# Patient Record
Sex: Female | Born: 1975
Health system: Southern US, Community
[De-identification: ages and names within clinical notes are randomized; demographics above are authoritative.]

## PROBLEM LIST (undated history)

## (undated) DIAGNOSIS — IMO0001 Reserved for inherently not codable concepts without codable children: Secondary | ICD-10-CM

## (undated) DIAGNOSIS — N809 Endometriosis, unspecified: Secondary | ICD-10-CM

## (undated) DIAGNOSIS — K219 Gastro-esophageal reflux disease without esophagitis: Secondary | ICD-10-CM

## (undated) DIAGNOSIS — F410 Panic disorder [episodic paroxysmal anxiety] without agoraphobia: Secondary | ICD-10-CM

## (undated) DIAGNOSIS — T7840XA Allergy, unspecified, initial encounter: Secondary | ICD-10-CM

## (undated) DIAGNOSIS — Q5210 Doubling of vagina, unspecified: Secondary | ICD-10-CM

## (undated) DIAGNOSIS — D649 Anemia, unspecified: Secondary | ICD-10-CM

## (undated) HISTORY — DX: Anemia, unspecified: D64.9

## (undated) HISTORY — DX: Gastro-esophageal reflux disease without esophagitis: K21.9

## (undated) HISTORY — PX: CHOLECYSTECTOMY: SHX55

## (undated) HISTORY — DX: Reserved for inherently not codable concepts without codable children: IMO0001

## (undated) HISTORY — DX: Allergy, unspecified, initial encounter: T78.40XA

## (undated) HISTORY — DX: Endometriosis, unspecified: N80.9

## (undated) HISTORY — DX: Doubling of vagina, unspecified: Q52.10

---

## 2005-01-02 HISTORY — PX: OVARIAN CYST REMOVAL: SHX89

## 2012-01-16 ENCOUNTER — Emergency Department (HOSPITAL_BASED_OUTPATIENT_CLINIC_OR_DEPARTMENT_OTHER)
Admission: EM | Admit: 2012-01-16 | Discharge: 2012-01-16 | Disposition: A | Discharge status: Home / Self Care | Payer: BC Managed Care – PPO | Attending: Emergency Medicine | Admitting: Emergency Medicine

## 2012-01-16 ENCOUNTER — Emergency Department (HOSPITAL_BASED_OUTPATIENT_CLINIC_OR_DEPARTMENT_OTHER): Payer: BC Managed Care – PPO

## 2012-01-16 ENCOUNTER — Encounter (HOSPITAL_BASED_OUTPATIENT_CLINIC_OR_DEPARTMENT_OTHER): Payer: Self-pay

## 2012-01-16 DIAGNOSIS — D1809 Hemangioma of other sites: Secondary | ICD-10-CM | POA: Insufficient documentation

## 2012-01-16 DIAGNOSIS — Z3202 Encounter for pregnancy test, result negative: Secondary | ICD-10-CM | POA: Insufficient documentation

## 2012-01-16 DIAGNOSIS — D1803 Hemangioma of intra-abdominal structures: Secondary | ICD-10-CM

## 2012-01-16 DIAGNOSIS — Z8659 Personal history of other mental and behavioral disorders: Secondary | ICD-10-CM | POA: Insufficient documentation

## 2012-01-16 DIAGNOSIS — R110 Nausea: Secondary | ICD-10-CM | POA: Insufficient documentation

## 2012-01-16 DIAGNOSIS — R109 Unspecified abdominal pain: Secondary | ICD-10-CM

## 2012-01-16 HISTORY — DX: Panic disorder (episodic paroxysmal anxiety): F41.0

## 2012-01-16 LAB — COMPREHENSIVE METABOLIC PANEL
ALT: 13 U/L (ref 0–35)
AST: 13 U/L (ref 0–37)
CO2: 23 mEq/L (ref 19–32)
Chloride: 103 mEq/L (ref 96–112)
GFR calc Af Amer: >90 mL/min (ref >90)
GFR calc non Af Amer: >90 mL/min (ref >90)
Glucose, Bld: 106 mg/dL — ABNORMAL HIGH (ref 70–99)
Sodium: 139 mEq/L (ref 135–145)
Total Bilirubin: 0.3 mg/dL (ref 0.3–1.2)

## 2012-01-16 LAB — CBC WITH DIFFERENTIAL/PLATELET
Basophils Absolute: 0 10*3/uL (ref 0.0–0.1)
Eosinophils Relative: 1 % (ref 0–5)
HCT: 41.2 % (ref 36.0–46.0)
Lymphocytes Relative: 17 % (ref 12–46)
Lymphs Abs: 1.8 10*3/uL (ref 0.7–4.0)
MCV: 93 fL (ref 78.0–100.0)
Monocytes Absolute: 0.7 10*3/uL (ref 0.1–1.0)
Neutro Abs: 7.8 10*3/uL — ABNORMAL HIGH (ref 1.7–7.7)
Platelets: 291 10*3/uL (ref 150–400)
RBC: 4.43 MIL/uL (ref 3.87–5.11)
RDW: 12.5 % (ref 11.5–15.5)
WBC: 10.4 10*3/uL (ref 4.0–10.5)

## 2012-01-16 LAB — URINALYSIS, ROUTINE W REFLEX MICROSCOPIC
Bilirubin Urine: NEGATIVE
Specific Gravity, Urine: 1.014 (ref 1.005–1.030)
Urobilinogen, UA: 0.2 mg/dL (ref 0.0–1.0)
pH: 8 (ref 5.0–8.0)

## 2012-01-16 LAB — PREGNANCY, URINE: Preg Test, Ur: NEGATIVE

## 2012-01-16 MED ORDER — PANTOPRAZOLE SODIUM 20 MG PO TBEC: 20.0000 mg | DELAYED_RELEASE_TABLET | Freq: Every day | ORAL | Status: DC

## 2012-01-16 NOTE — ED Notes (Signed)
Pt voided prior to coming to tx area-given CCUA kit and advised to let staff know if voids

## 2012-01-16 NOTE — ED Provider Notes (Signed)
History     CSN: [id number]  Arrival date & time 01/16/12  1406   First MD Initiated Contact with Patient 01/16/12 1417      Chief Complaint  Patient presents with  . Abdominal Pain    (Consider location/radiation/quality/duration/timing/severity/associated sxs/prior treatment) HPI Comments: Pt states that she has had burning in the upper abdomen for the last 3 weeks and now the burning has been persistent for the last NWG:NFAOZHY makes it better or worse  Patient is a 37 y.o. female presenting with abdominal pain. The history is provided by the patient. No language interpreter was used.  Abdominal Pain The primary symptoms of the illness include abdominal pain, nausea and vaginal bleeding. The primary symptoms of the illness do not include fever, vomiting, dysuria or vaginal discharge. The current episode started more than 2 days ago. The onset of the illness was gradual. The problem has not changed since onset.   Past Medical History  Diagnosis Date  . Panic attacks     Past Surgical History  Procedure Date  . Cesarean section     No family history on file.  History  Substance Use Topics  . Smoking status: Never Smoker   . Smokeless tobacco: Not on file  . Alcohol Use: No    OB History    Grav Para Term Preterm Abortions TAB SAB Ect Mult Living                  Review of Systems  Constitutional: Negative for fever.  Respiratory: Negative.   Cardiovascular: Negative.   Gastrointestinal: Positive for nausea and abdominal pain. Negative for vomiting.  Genitourinary: Positive for vaginal bleeding. Negative for dysuria and vaginal discharge.    Allergies  Demerol  Home Medications   Current Outpatient Rx  Name  Route  Sig  Dispense  Refill  . CLARITIN PO   Oral   Take by mouth.           BP 121/88  Pulse 100  Temp 98.8 F (37.1 C) (Oral)  Resp 20  Ht 5\' 5"  (1.651 m)  Wt 168 lb (76.204 kg)  BMI 27.96 kg/m2  SpO2 100%  LMP  01/16/2012  Physical Exam  Nursing note and vitals reviewed. Constitutional: She is oriented to person, place, and time. She appears well-developed and well-nourished.  HENT:  Head: Normocephalic and atraumatic.  Neck: Normal range of motion. Neck supple.  Cardiovascular: Normal rate and regular rhythm.   Pulmonary/Chest: Effort normal and breath sounds normal.  Abdominal: Soft. Bowel sounds are normal. There is tenderness in the epigastric area.  Musculoskeletal: Normal range of motion.  Neurological: She is alert and oriented to person, place, and time.  Skin: Skin is warm and dry.  Psychiatric: She has a normal mood and affect.    ED Course  Procedures (including critical care time)  Labs Reviewed  URINALYSIS, ROUTINE W REFLEX MICROSCOPIC - Abnormal; Notable for the following:    Hgb urine dipstick LARGE (*)     All other components within normal limits  CBC WITH DIFFERENTIAL - Abnormal; Notable for the following:    Neutro Abs 7.8 (*)     All other components within normal limits  COMPREHENSIVE METABOLIC PANEL - Abnormal; Notable for the following:    Potassium 3.4 (*)     Glucose, Bld 106 (*)     All other components within normal limits  PREGNANCY, URINE  LIPASE, BLOOD  URINE MICROSCOPIC-ADD ON   US Abdomen Complete  01/16/2012  *  RADIOLOGY REPORT*  Clinical Data:  Abdominal pain  COMPLETE ABDOMINAL ULTRASOUND  Comparison:  None.  Findings:  Gallbladder:  No gallstones, gallbladder wall thickening, or pericholecystic fluid.  Negative sonographic Murphy's sign.  Common bile duct:  Measures 3 mm.  Liver:  7 x 6 x 6 mm probable hemangioma in the right hepatic lobe. Within normal limits in parenchymal echogenicity.  IVC:  Appears normal.  Pancreas:  Visualized portions of the pancreatic head and body are within normal limits.  Spleen:  Measures 5.2 cm.  Right Kidney:  Measures 10.9 cm.  No mass or hydronephrosis.  Left Kidney:  Measures 10.7 cm.  No mass or hydronephrosis.   Abdominal aorta:  No aneurysm identified.  IMPRESSION: Suspected 7 mm hemangioma in the right hepatic lobe.  Otherwise negative abdominal ultrasound.   Original Report Authenticated By: Charline Bills, M.D.      1. Hemangioma of liver   2. Abdominal pain       MDM  Discussed findings with pt and pt is given gi follow up        Teressa Lower, NP 01/16/12 1732

## 2012-01-16 NOTE — ED Notes (Signed)
Pt reports abdominal pain described as burning x 3 weeks.

## 2012-01-22 NOTE — ED Provider Notes (Signed)
Medical screening examination/treatment/procedure(s) were performed by non-physician practitioner and as supervising physician I was immediately available for consultation/collaboration.   Rolan Bucco, MD 01/22/12 [phone]

## 2012-10-16 ENCOUNTER — Other Ambulatory Visit: Payer: Self-pay | Admitting: Internal Medicine

## 2012-10-16 DIAGNOSIS — N63 Unspecified lump in unspecified breast: Secondary | ICD-10-CM

## 2012-10-28 ENCOUNTER — Ambulatory Visit
Admission: RE | Admit: 2012-10-28 | Discharge: 2012-10-28 | Disposition: A | Discharge status: Home / Self Care | Payer: BC Managed Care – PPO | Source: Ambulatory Visit | Attending: Internal Medicine | Admitting: Internal Medicine

## 2012-10-28 DIAGNOSIS — N63 Unspecified lump in unspecified breast: Secondary | ICD-10-CM

## 2014-03-05 ENCOUNTER — Ambulatory Visit (INDEPENDENT_AMBULATORY_CARE_PROVIDER_SITE_OTHER): Payer: BLUE CROSS/BLUE SHIELD | Admitting: Medical

## 2014-03-05 ENCOUNTER — Encounter: Payer: Self-pay | Admitting: Medical

## 2014-03-05 VITALS — BP 110/79 | HR 87 | Temp 98.3°F | Wt 165.4 lb

## 2014-03-05 DIAGNOSIS — F411 Generalized anxiety disorder: Secondary | ICD-10-CM

## 2014-03-05 DIAGNOSIS — J302 Other seasonal allergic rhinitis: Secondary | ICD-10-CM

## 2014-03-05 DIAGNOSIS — R82998 Other abnormal findings in urine: Secondary | ICD-10-CM

## 2014-03-05 DIAGNOSIS — R319 Hematuria, unspecified: Secondary | ICD-10-CM

## 2014-03-05 DIAGNOSIS — N390 Urinary tract infection, site not specified: Secondary | ICD-10-CM

## 2014-03-05 DIAGNOSIS — N809 Endometriosis, unspecified: Secondary | ICD-10-CM

## 2014-03-05 HISTORY — DX: Generalized anxiety disorder: F41.1

## 2014-03-05 LAB — POCT URINALYSIS DIPSTICK
BILIRUBIN UA: NEGATIVE
GLUCOSE UA: NEGATIVE
Ketones, UA: NEGATIVE
NITRITE UA: NEGATIVE
Spec Grav, UA: 1.02
UROBILINOGEN UA: 0.2
pH, UA: 7

## 2014-03-05 MED ORDER — CIPROFLOXACIN HCL 250 MG PO TABS: 250.0000 mg | ORAL_TABLET | Freq: Two times a day (BID) | ORAL | Status: DC

## 2014-03-05 NOTE — Progress Notes (Signed)
Subjective:    Patient ID: Alicia Duffy, female    DOB: 1975/03/27, 39 y.o.   MRN: [id number]  HPI   I have reviewed pt PMH, PSH, FH, Social History and Surgical History  Allergies- seasonal. Spring and Winter is the worst.   Anxiety- She states 2007 4 months of anxiety. Was on medications and she wheened herself of med over period of time. She was on clonzapam.   Mother- Had pancreatic cancer.   Pt has hx of some urinary symptoms. She feel like her bladder is contracting since december. She has been seen twice and 2 cultures are negative. Some trace blood was seen in past.   Occasionally feels like chills with symptoms. Some occasional rt sided back pain about 2 wks ago. Come and goes. She points to cva area.  Pt took antibiotic in past and she did not get better.   Does have endometriosis hx.  LMP- February 8th and normal.`  Works season at [employer], no exercise, No caffeine. Married- 2 children. Both born csections.     Review of Systems  Constitutional: Negative for fever, chills and fatigue.  HENT: Negative for congestion, ear discharge, ear pain, nosebleeds, postnasal drip, rhinorrhea, sinus pressure, sore throat and trouble swallowing.   Respiratory: Negative for cough, chest tightness, shortness of breath and wheezing.   Cardiovascular: Negative for chest pain and palpitations.  Gastrointestinal: Negative for nausea, vomiting, abdominal pain, diarrhea and constipation.  Genitourinary: Negative for dysuria, frequency, flank pain, vaginal bleeding, vaginal discharge and vaginal pain.       Cramping of bladder sensation. Suprapubic pain on exam. Hx of hematuria and leukoctes. Culture neg.  Some pain reported on urination in past. But none today.  Musculoskeletal: Negative for back pain.  Neurological: Negative for dizziness, tremors, seizures, syncope, weakness, light-headedness, numbness and headaches.  Hematological: Negative for adenopathy. Does not  bruise/bleed easily.  Psychiatric/Behavioral: Negative for suicidal ideas, behavioral problems and dysphoric mood. The patient is not nervous/anxious.    Past Medical History  Diagnosis Date  . Panic attacks   . Allergy     History   Social History  . Marital Status: Married    Spouse Name: N/A  . Number of Children: N/A  . Years of Education: N/A   Occupational History  . Not on file.   Social History Main Topics  . Smoking status: Never Smoker   . Smokeless tobacco: Not on file  . Alcohol Use: No  . Drug Use: No  . Sexual Activity: Yes   Other Topics Concern  . Not on file   Social History Narrative    Past Surgical History  Procedure Laterality Date  . Cesarean section    . Cholecystectomy      Family History  Problem Relation Age of Onset  . Cancer Mother   . Heart disease Father   . Depression Father     Allergies  Allergen Reactions  . Demerol [Meperidine] Itching    Current Outpatient Prescriptions on File Prior to Visit  Medication Sig Dispense Refill  . Loratadine (CLARITIN PO) Take by mouth.     No current facility-administered medications on file prior to visit.    BP 110/79 mmHg  Pulse 87  Temp(Src) 98.3 F (36.8 C) (Oral)  Wt 165 lb 6.4 oz (75.025 kg)  SpO2 100%  LMP 02/09/2014      Objective:   Physical Exam General  Mental Status- Alert. Orientation- Orientation x 4.   Skin General:- Normal. Moisture-  Dry. Temperature- Warm.  HEENT Head- normal.  Neck Neck- Supple.  Heart Ausculation-RRR  Lungs Ausculation- Clear, even, unlabored bilaterlly.    Abdomen Palpation/Percussion: Palpation and Percussion of the abdomen reveal- faint Tender suprapubic area, No Rebound tenderness, No Rigidity(guarding), No Palpable abdominal masses and No jar tenderness. No suprapubic tenderness. Liver:-Normal. Spleen:- Normal. Other Characteristics- No Costovertebral angle tenderness- Left or Costovertebral angle tenderness- Right.    Auscultation: Auscultation of the abdomen reveals- Bowel Sounds normal.  Back- no cva tenderness.     Assessment & Plan:

## 2014-03-05 NOTE — Assessment & Plan Note (Signed)
Hx of but no symptoms recently per her report.

## 2014-03-05 NOTE — Patient Instructions (Signed)
I will get urine culture today. I will refer you to urologsit for history of hemturia as well as possible interstiitial cystitis.  During the interim will rx 3 days of cipro.  May in future refer you to gyn with urologist work up negative. If cause found then we can do your pap here.  Follow up in 3-5 wks(any persisting signs or symptoms) or as needed.

## 2014-03-05 NOTE — Progress Notes (Signed)
Pre visit review using our clinic review tool, if applicable. No additional management support is needed unless otherwise documented below in the visit note. 

## 2014-03-05 NOTE — Assessment & Plan Note (Signed)
Hx of but well controlled. She has read books and knows how to handle her anxiety per her report.

## 2014-03-07 LAB — URINE CULTURE
Colony Count: NO GROWTH
ORGANISM ID, BACTERIA: NO GROWTH

## 2014-03-11 ENCOUNTER — Ambulatory Visit: Payer: Self-pay | Admitting: Physician Assistant

## 2014-03-30 ENCOUNTER — Encounter: Payer: Self-pay | Admitting: Internal Medicine

## 2014-03-30 ENCOUNTER — Telehealth: Payer: Self-pay | Admitting: Medical

## 2014-03-30 ENCOUNTER — Ambulatory Visit (INDEPENDENT_AMBULATORY_CARE_PROVIDER_SITE_OTHER): Payer: BLUE CROSS/BLUE SHIELD | Admitting: Internal Medicine

## 2014-03-30 VITALS — BP 118/64 | HR 88 | Temp 98.0°F | Wt 162.0 lb

## 2014-03-30 DIAGNOSIS — N809 Endometriosis, unspecified: Secondary | ICD-10-CM

## 2014-03-30 DIAGNOSIS — N390 Urinary tract infection, site not specified: Secondary | ICD-10-CM | POA: Diagnosis not present

## 2014-03-30 DIAGNOSIS — R82998 Other abnormal findings in urine: Secondary | ICD-10-CM

## 2014-03-30 DIAGNOSIS — A048 Other specified bacterial intestinal infections: Secondary | ICD-10-CM | POA: Insufficient documentation

## 2014-03-30 DIAGNOSIS — M549 Dorsalgia, unspecified: Secondary | ICD-10-CM | POA: Diagnosis not present

## 2014-03-30 LAB — POCT URINE PREGNANCY: PREG TEST UR: NEGATIVE

## 2014-03-30 NOTE — Patient Instructions (Signed)
  Drink plenty of fluids  Keep your appointment to see the gynecologist   Keep appointment to see the urologist  Call if you are not back to normal next month

## 2014-03-30 NOTE — Assessment & Plan Note (Signed)
See comments under leukocyturia

## 2014-03-30 NOTE — Assessment & Plan Note (Addendum)
UTI? The patient had a UTI back in December based on symptoms and urinalysis. A urine culture a few days ago was negative, Udip did show a trace of red cells and white cells. I don't think she has a UTI at this point, we will recheck a UA mostly to see if she has pyuria or microscopic hematuria. Also rx an  abdominal ultrasound to assess the kidneys. She already has an appointment tosee  urology.  As far as the lower abdominal pain today, she has a history of endometriosis and they pain resembled previous episodes. She has an appointment to see gynecologist in few days consequently will let the gynecologist completed the assessment next week, needs a pelvic exam although she is low risk for PID. UPT neg

## 2014-03-30 NOTE — Progress Notes (Signed)
Pre visit review using our clinic review tool, if applicable. No additional management support is needed unless otherwise documented below in the visit note. 

## 2014-03-30 NOTE — Telephone Encounter (Signed)
Patient Name: Alicia Duffy  DOB: 10-04-1975    Initial Comment caller states she is short of breath, pain under shoulder blade radiating down her back and groin area and has a metallic taste in her mouth   Nurse Assessment  Nurse: Wynetta Emery, RN, Baker Janus Date/Time Eilene Ghazi Time): 03/30/2014 7:56:42 AM  Confirm and document reason for call. If symptomatic, describe symptoms. ---[name] states the symptoms are getting worse have had these since December; She continues to be short of breath, pain under shoulder blade radiating down her back and pain started in groin area this weekend and continues to have a metallic taste in her mouth  Has the patient traveled out of the country within the last 30 days? ---No  Does the patient require triage? ---Yes  Related visit to physician within the last 2 weeks? ---No  Does the PT have any chronic conditions? (i.e. diabetes, asthma, etc.) ---No  Did the patient indicate they were pregnant? ---No     Guidelines    Guideline Title Affirmed Question Affirmed Notes  Back Pain [1] SEVERE back pain (e.g., excruciating, unable to do any normal activities) AND [2] not improved 2 hours after pain medicine    Final Disposition User   See Physician within 4 Hours (or PCP triage) Wynetta Emery, RN, Baker Janus    Comments  States she wants an appt today if possible and with someone else to get different opinion; she thinks she has a major UTI that has not been treated

## 2014-03-30 NOTE — Progress Notes (Signed)
Subjective:    Patient ID: Alicia Duffy, female    DOB: January 18, 1975, 39 y.o.   MRN: [id number]  DOS:  03/30/2014 Type of visit - description : acute Interval history: Symptoms started in October, was seen for the first time 12/17/2013 @ [facility] with the following symptoms: Urinary urgency, frequency, dysuria, chills. At that time, the urine show RBCs and WBCs too numerous to count. Urine culture is not available to me. BMP, LFTs, CBC normal. H. pylori serology and breath  test negative per patient. She was not feeling better and subsequently went to another  Clinic [facility]) , they check a urine and she had trace blood, they did not prescribe further antibiotics. Eventually after more than a week urinary sx improved and she become asymptomatic. She was seen in this office recently, she was still concerned by the fact that she had red cell an in the urine when she went to [place], udip here  showed a trace of red cells and white cells (no UA), Cipro was prescribed empirically, urine culture was negative. She is here and for a follow-up on wonders if she still has a UTI.   On further questioning, she is having lower abdominal discomfort for the last few days along with low back pain, this is not the first time she has dose symptoms, has a history of endometriosis.     Review of Systems  no fever, occasional chills, no recent weight loss. Some nausea, no vomiting, diarrhea blood in the stools. No vaginal discharge or vaginal bleeding Last menstrual period 03/13/2014. Periods  regular.  Again no uti sx at this time   Past Medical History  Diagnosis Date  . Panic attacks   . Allergy   . Endometriosis     dx 2007 at time of surhery  . Contraception     husband vasectomy    Past Surgical History  Procedure Laterality Date  . Cesarean section    . Cholecystectomy    . Ovarian cyst removal Left 2007    History   Social History  . Marital Status: Married    Spouse Name: N/A    . Number of Children: 2  . Years of Education: N/A   Occupational History  . works part time     Social History Main Topics  . Smoking status: Never Smoker   . Smokeless tobacco: Not on file  . Alcohol Use: No  . Drug Use: No  . Sexual Activity: Yes   Other Topics Concern  . Not on file   Social History Narrative        Medication List       This list is accurate as of: 03/30/14  9:39 PM.  Always use your most recent med list.               CLARITIN PO  Take by mouth.     WOMENS MULTI VITAMIN & MINERAL PO  Take by mouth.           Objective:   Physical Exam BP 118/64 mmHg  Pulse 88  Temp(Src) 98 F (36.7 C) (Oral)  Wt 162 lb (73.483 kg)  SpO2 98%  LMP 03/13/2014 (Approximate)  General:   Well developed, well nourished . NAD.  HEENT:  Normocephalic . Face symmetric, atraumatic Lungs:  CTA B Normal respiratory effort, no intercostal retractions, no accessory muscle use. Heart: RRR,  no murmur.  Abdomen:  Not distended, soft,  Mild tenderness lower abdomen and bilateral sides, no  rebound or rigidity. No mass,organomegaly Muscle skeletal: no pretibial edema bilaterally  Skin: Not pale. Not jaundice Neurologic:  alert & oriented X3.  Speech normal, gait appropriate for age and unassisted Psych--  Cognition and judgment appear intact.  Cooperative with normal attention span and concentration.  Behavior appropriate. No anxious or depressed appearing.

## 2014-03-30 NOTE — Telephone Encounter (Signed)
Patient has appointment 2pm today.

## 2014-03-31 ENCOUNTER — Ambulatory Visit (HOSPITAL_BASED_OUTPATIENT_CLINIC_OR_DEPARTMENT_OTHER)
Admission: RE | Admit: 2014-03-31 | Discharge: 2014-03-31 | Disposition: A | Discharge status: Home / Self Care | Payer: BLUE CROSS/BLUE SHIELD | Source: Ambulatory Visit | Attending: Internal Medicine | Admitting: Internal Medicine

## 2014-03-31 DIAGNOSIS — N390 Urinary tract infection, site not specified: Secondary | ICD-10-CM | POA: Insufficient documentation

## 2014-03-31 DIAGNOSIS — R82998 Other abnormal findings in urine: Secondary | ICD-10-CM

## 2014-03-31 LAB — URINALYSIS, ROUTINE W REFLEX MICROSCOPIC
BILIRUBIN URINE: NEGATIVE
HGB URINE DIPSTICK: NEGATIVE
Ketones, ur: NEGATIVE
LEUKOCYTES UA: NEGATIVE
NITRITE: NEGATIVE
PH: 6 (ref 5.0–8.0)
Specific Gravity, Urine: 1.025 (ref 1.000–1.030)
Total Protein, Urine: NEGATIVE
URINE GLUCOSE: NEGATIVE
Urobilinogen, UA: 0.2 (ref 0.0–1.0)

## 2014-05-15 ENCOUNTER — Encounter: Payer: Self-pay | Admitting: Internal Medicine

## 2014-07-13 ENCOUNTER — Ambulatory Visit (HOSPITAL_BASED_OUTPATIENT_CLINIC_OR_DEPARTMENT_OTHER)
Admission: RE | Admit: 2014-07-13 | Discharge: 2014-07-13 | Disposition: A | Discharge status: Home / Self Care | Payer: BLUE CROSS/BLUE SHIELD | Source: Ambulatory Visit | Attending: Internal Medicine | Admitting: Internal Medicine

## 2014-07-13 ENCOUNTER — Ambulatory Visit (INDEPENDENT_AMBULATORY_CARE_PROVIDER_SITE_OTHER): Payer: BLUE CROSS/BLUE SHIELD | Admitting: Internal Medicine

## 2014-07-13 ENCOUNTER — Encounter: Payer: Self-pay | Admitting: Internal Medicine

## 2014-07-13 ENCOUNTER — Telehealth: Payer: Self-pay | Admitting: Internal Medicine

## 2014-07-13 VITALS — BP 124/64 | HR 81 | Temp 98.0°F | Ht 64.5 in | Wt 162.2 lb

## 2014-07-13 DIAGNOSIS — M79602 Pain in left arm: Secondary | ICD-10-CM | POA: Insufficient documentation

## 2014-07-13 DIAGNOSIS — R0789 Other chest pain: Secondary | ICD-10-CM

## 2014-07-13 DIAGNOSIS — F411 Generalized anxiety disorder: Secondary | ICD-10-CM

## 2014-07-13 DIAGNOSIS — R079 Chest pain, unspecified: Secondary | ICD-10-CM

## 2014-07-13 DIAGNOSIS — N644 Mastodynia: Secondary | ICD-10-CM | POA: Diagnosis not present

## 2014-07-13 HISTORY — DX: Chest pain, unspecified: R07.9

## 2014-07-13 HISTORY — DX: Other chest pain: R07.89

## 2014-07-13 MED ORDER — LORATADINE 10 MG PO TABS: 10.0000 mg | ORAL_TABLET | Freq: Every day | ORAL | Status: DC

## 2014-07-13 MED ORDER — CLONAZEPAM 0.5 MG PO TABS: 0.5000 mg | ORAL_TABLET | Freq: Two times a day (BID) | ORAL | Status: DC | PRN

## 2014-07-13 NOTE — Telephone Encounter (Signed)
Noted  

## 2014-07-13 NOTE — Assessment & Plan Note (Signed)
39 year old female, nonsmoker, presents with atypical chest pain. EKG normal sinus rhythm, physical exam is negative. History does not point to a cardiovascular etiology or  PE.  Plan: For completeness recommend a chest x-ray and rx observation. If she is not gradually improving she will let me know.

## 2014-07-13 NOTE — Assessment & Plan Note (Signed)
  History of anxiety, she takes clonazepam 0.5 one twice a day very seldom, last prescription was several years ago and she still has a leftover. Request a refill. Plan: Refill meds, to take sparingly, knows not to drive if she takes clonazepam

## 2014-07-13 NOTE — Patient Instructions (Signed)
Stop by the first floor and get the XR     Tylenol  500 mg OTC 2 tabs a day every 8 hours as needed for pain  IBUPROFEN (Advil or Motrin) 200 mg 2 tablets every 6 hours as needed for pain.  Always take it with food because may cause gastritis and ulcers.  If you notice nausea, stomach pain, change in the color of stools --->  Stop the medicine and let us know \

## 2014-07-13 NOTE — Progress Notes (Signed)
Pre visit review using our clinic review tool, if applicable. No additional management support is needed unless otherwise documented below in the visit note. 

## 2014-07-13 NOTE — Progress Notes (Signed)
   Subjective:    Patient ID: Alicia Duffy, female    DOB: Apr 09, 1975, 39 y.o.   MRN: [id number]  DOS:  07/13/2014 Type of visit - description : Acute Interval history:  5 days history of chest pain, located at the left side, just proximal from the breast, on and off, episodes are random, at rest, when doing something, sometimes wake her up from sleep. They last from minutes to all day, today she has been hurting for several hours. Sometimes he goes to the shoulder or ready to straight back. Does not change with deep breaths or by eating.   Review of Systems  Denies fever chills No nausea, vomiting, diarrhea or blood in the stools No GERD symptoms No cough No rash Self breast examination normal. Denies leg pain, swelling or recent prolonged trips  Past Medical History  Diagnosis Date  . Panic attacks   . Allergy   . Endometriosis     dx 2007 at time of surhery  . Contraception     husband vasectomy  . Vaginal septum     [name], OB/GYN    Past Surgical History  Procedure Laterality Date  . Cesarean section    . Cholecystectomy    . Ovarian cyst removal Left 2007    History   Social History  . Marital Status: Married    Spouse Name: N/A  . Number of Children: 2  . Years of Education: N/A   Occupational History  . works part time     Social History Main Topics  . Smoking status: Never Smoker   . Smokeless tobacco: Not on file  . Alcohol Use: No  . Drug Use: No  . Sexual Activity: Yes   Other Topics Concern  . Not on file   Social History Narrative        Medication List       This list is accurate as of: 07/13/14  3:23 PM.  Always use your most recent med list.               CLARITIN PO  Take by mouth.     WOMENS MULTI VITAMIN & MINERAL PO  Take by mouth.           Objective:   Physical Exam BP 124/64 mmHg  Pulse 81  Temp(Src) 98 F (36.7 C) (Oral)  Ht 5' 4.5" (1.638 m)  Wt 162 lb 4 oz (73.596 kg)  BMI 27.43 kg/m2   SpO2 98%  LMP 07/06/2014 (Approximate) General:   Well developed, well nourished . NAD.  HEENT:  Normocephalic . Face symmetric, atraumatic Neck: No TTP at the cervical spine. No mass or LAD Lungs:  CTA B Normal respiratory effort, no intercostal retractions, no accessory muscle use. Heart: RRR,  no murmur.  No pretibial edema bilaterally  Chest wall: No TTP Breast: no dominant mass, skin and nipples normal to inspection on palpation, axillary areas without mass or lymphadenopathy Abdomen: Not distended, soft, minimal tenderness at the lower abdomen, not a new finding per patient.  Skin: Not pale. Not jaundice, no rash Neurologic:  alert & oriented X3.  Speech normal, gait appropriate for age and unassisted Psych--  Cognition and judgment appear intact.  Cooperative with normal attention span and concentration.  Behavior appropriate. No anxious or depressed appearing.        Assessment & Plan:

## 2014-07-13 NOTE — Telephone Encounter (Signed)
Patient Name: ZADA HASER DOB: 1975-03-01 Initial Comment caller states they are c/o left chest and breast pain - states it feels like a electrical current is going thru her Nurse Assessment Nurse: Ronnald Ramp, RN, Miranda Date/Time (Eastern Time): 07/13/2014 10:24:02 AM Confirm and document reason for call. If symptomatic, describe symptoms. ---Caller states she is having pain in her right breast for 5 days. Has the patient traveled out of the country within the last 30 days? ---Not Applicable Does the patient require triage? ---Yes Related visit to physician within the last 2 weeks? ---No Does the PT have any chronic conditions? (i.e. diabetes, asthma, etc.) ---Yes List chronic conditions. ---Allergies Did the patient indicate they were pregnant? ---No Guidelines Guideline Title Affirmed Question Affirmed Notes Breast Symptoms [1] Breast pain AND [2] cause is not known Final Disposition User See PCP within 2 Ronny Flurry, RN, Miranda Comments Appt scheduled for today at 2:45pm with Dr. Larose Kells

## 2015-01-29 ENCOUNTER — Encounter: Payer: Self-pay | Admitting: Internal Medicine

## 2015-01-29 ENCOUNTER — Ambulatory Visit (INDEPENDENT_AMBULATORY_CARE_PROVIDER_SITE_OTHER): Payer: BLUE CROSS/BLUE SHIELD | Admitting: Internal Medicine

## 2015-01-29 VITALS — BP 118/74 | HR 84 | Temp 97.7°F | Ht 64.5 in | Wt 155.0 lb

## 2015-01-29 DIAGNOSIS — F411 Generalized anxiety disorder: Secondary | ICD-10-CM

## 2015-01-29 DIAGNOSIS — R1010 Upper abdominal pain, unspecified: Secondary | ICD-10-CM | POA: Diagnosis not present

## 2015-01-29 LAB — URINALYSIS, ROUTINE W REFLEX MICROSCOPIC
BILIRUBIN URINE: NEGATIVE
HGB URINE DIPSTICK: NEGATIVE
Ketones, ur: NEGATIVE
LEUKOCYTES UA: NEGATIVE
NITRITE: NEGATIVE
Specific Gravity, Urine: >=1.03 — AB (ref 1.000–1.030)
TOTAL PROTEIN, URINE-UPE24: NEGATIVE
UROBILINOGEN UA: 0.2 (ref 0.0–1.0)
Urine Glucose: NEGATIVE
pH: 6 (ref 5.0–8.0)

## 2015-01-29 LAB — COMPREHENSIVE METABOLIC PANEL
ALBUMIN: 4.3 g/dL (ref 3.5–5.2)
ALK PHOS: 56 U/L (ref 39–117)
ALT: 13 U/L (ref 0–35)
AST: 14 U/L (ref 0–37)
BUN: 11 mg/dL (ref 6–23)
CHLORIDE: 103 meq/L (ref 96–112)
CO2: 26 mEq/L (ref 19–32)
Calcium: 9.2 mg/dL (ref 8.4–10.5)
Creatinine, Ser: 0.62 mg/dL (ref 0.40–1.20)
GFR: 113.53 mL/min (ref >60.00)
Glucose, Bld: 96 mg/dL (ref 70–99)
POTASSIUM: 3.7 meq/L (ref 3.5–5.1)
SODIUM: 137 meq/L (ref 135–145)
Total Bilirubin: 0.5 mg/dL (ref 0.2–1.2)
Total Protein: 7.1 g/dL (ref 6.0–8.3)

## 2015-01-29 LAB — CBC WITH DIFFERENTIAL/PLATELET
BASOS PCT: 0.6 % (ref 0.0–3.0)
Basophils Absolute: 0.1 10*3/uL (ref 0.0–0.1)
EOS PCT: 1.1 % (ref 0.0–5.0)
Eosinophils Absolute: 0.1 10*3/uL (ref 0.0–0.7)
HEMATOCRIT: 41.5 % (ref 36.0–46.0)
HEMOGLOBIN: 13.7 g/dL (ref 12.0–15.0)
LYMPHS PCT: 27.6 % (ref 12.0–46.0)
Lymphs Abs: 2.2 10*3/uL (ref 0.7–4.0)
MCHC: 33.1 g/dL (ref 30.0–36.0)
MCV: 92.8 fl (ref 78.0–100.0)
MONO ABS: 0.6 10*3/uL (ref 0.1–1.0)
MONOS PCT: 8.1 % (ref 3.0–12.0)
Neutro Abs: 5 10*3/uL (ref 1.4–7.7)
Neutrophils Relative %: 62.6 % (ref 43.0–77.0)
Platelets: 346 10*3/uL (ref 150.0–400.0)
RBC: 4.47 Mil/uL (ref 3.87–5.11)
RDW: 13 % (ref 11.5–15.5)
WBC: 7.9 10*3/uL (ref 4.0–10.5)

## 2015-01-29 LAB — AMYLASE: Amylase: 42 U/L (ref 27–131)

## 2015-01-29 LAB — LIPASE: Lipase: 11 U/L (ref 11.0–59.0)

## 2015-01-29 MED ORDER — PANTOPRAZOLE SODIUM 40 MG PO TBEC
40.0000 mg | DELAYED_RELEASE_TABLET | Freq: Every day | ORAL | Status: DC
Start: 2015-01-29 — End: 2015-06-07

## 2015-01-29 MED ORDER — DICYCLOMINE HCL 10 MG PO CAPS: 10.0000 mg | ORAL_CAPSULE | Freq: Four times a day (QID) | ORAL | Status: DC | PRN

## 2015-01-29 NOTE — Progress Notes (Signed)
Subjective:    Patient ID: Alicia Duffy, female    DOB: 1975-11-12, 40 y.o.   MRN: [id number]  DOS:  01/29/2015 Type of visit - description : Acute visit, here with her husband Interval history: Symptoms started 3-4 weeks ago: On and off epigastric abdominal pain with involvement of the RUQ and right side of the abdomen. Episodes last 15 or 20 minutes, usually decreased after she drinks fluids, sx sometimes woke her up at night. Occasionally associated with nausea but not consistently so. The pain can be sharp dull or squeezing. Not really burning.   Review of Systems denies fever chills; appetite is slightly decreased No vomiting, occasional loose stools she thinks related to IBS. No blood in the stools, no constipation. No heartburn, dysphagia or odynophagia. No dysuria or gross hematuria.   Past Medical History  Diagnosis Date  . Panic attacks   . Allergy   . Endometriosis     dx 2007 at time of surhery  . Contraception     husband vasectomy  . Vaginal septum     [name], OB/GYN    Past Surgical History  Procedure Laterality Date  . Cesarean section    . Cholecystectomy    . Ovarian cyst removal Left 2007    Social History   Social History  . Marital Status: Married    Spouse Name: N/A  . Number of Children: 2  . Years of Education: N/A   Occupational History  . works part time     Social History Main Topics  . Smoking status: Never Smoker   . Smokeless tobacco: Not on file  . Alcohol Use: No  . Drug Use: No  . Sexual Activity: Yes   Other Topics Concern  . Not on file   Social History Narrative        Medication List       This list is accurate as of: 01/29/15  8:55 AM.  Always use your most recent med list.               clonazePAM 0.5 MG tablet  Commonly known as:  KLONOPIN  Take 1 tablet (0.5 mg total) by mouth 2 (two) times daily as needed for anxiety.     CULTURELLE DIGESTIVE HEALTH PO  Take 1 tablet by mouth daily.       loratadine 10 MG tablet  Commonly known as:  CLARITIN  Take 1 tablet (10 mg total) by mouth daily.     WOMENS MULTI VITAMIN & MINERAL PO  Take by mouth. Reported on 01/29/2015           Objective:   Physical Exam  Abdominal:     BP 118/74 mmHg  Pulse 84  Temp(Src) 97.7 F (36.5 C) (Oral)  Ht 5' 4.5" (1.638 m)  Wt 155 lb (70.308 kg)  BMI 26.20 kg/m2  SpO2 97%  LMP 01/09/2015 (Approximate) General:   Well developed, well nourished . NAD.  HEENT:  Normocephalic . Face symmetric, atraumatic Lungs:  CTA B Normal respiratory effort, no intercostal retractions, no accessory muscle use. Heart: RRR,  no murmur.  no pretibial edema bilaterally  Abdomen:  Not distended, soft, minimal tenderness at the epigastric area, no rebound or rigidity. No mass,organomegaly Skin: Not pale. Not jaundice Neurologic:  alert & oriented X3.  Speech normal, gait appropriate for age and unassisted Psych--  Cognition and judgment appear intact.  Cooperative with normal attention span and concentration.  Behavior appropriate.  Very emotional, tearful  during the exam.     Assessment & Plan:   Assessment Anxiety, panic attacks GI:  (most GI history per pt) --H pylory 2014, blood test, was rx abx, f/u breath test was (-) --IBS: Saw GI 2015, Dr Nicoletta Dress [facility]),  EGD wnl, no H Pylory, had a US-HIDA, saw surgery, GB removed, eventually dx w/ IBS GYN:  --endometriosis, Vaginal septum --Contraception: Husband vasectomy  PLAN: Upper, right-sided abdominal pain, on and off: Exam is benign, she has a history of IBS,  related to it? Plan: CBC, LFTs, amylase and lipase. Trial with Protonix, Bentyl. Reassess in 3 weeks, ER if symptoms severe. Anxiety: Emotional and worried about her symptoms, I wonder if anxiety is playing a role in her GI problems, we agreed to reassess anxiety in 3 weeks. (SSRIs?) RTC 3 weeks.

## 2015-01-29 NOTE — Progress Notes (Signed)
Pre visit review using our clinic review tool, if applicable. No additional management support is needed unless otherwise documented below in the visit note. 

## 2015-01-29 NOTE — Patient Instructions (Signed)
BEFORE YOU LEAVE THE OFFICE: GO TO THE LAB  Get the blood work  And a urine test   GO TO THE FRONT DESK  Schedule a routine office visit or check up to be done in  3 weeks  No  fasting  Front desk:  15      AFTER YOU LEAVE THE OFFICE: Pantoprazole 1 before breakfast Bentyl as needed for pain  Call if severe symptoms , fever , chills

## 2015-02-09 ENCOUNTER — Encounter: Payer: Self-pay | Admitting: Internal Medicine

## 2015-02-17 ENCOUNTER — Ambulatory Visit: Payer: BLUE CROSS/BLUE SHIELD | Admitting: Internal Medicine

## 2015-03-11 ENCOUNTER — Emergency Department (HOSPITAL_BASED_OUTPATIENT_CLINIC_OR_DEPARTMENT_OTHER)
Admission: EM | Admit: 2015-03-11 | Discharge: 2015-03-11 | Disposition: A | Discharge status: Home / Self Care | Payer: BLUE CROSS/BLUE SHIELD | Attending: Emergency Medicine | Admitting: Emergency Medicine

## 2015-03-11 ENCOUNTER — Emergency Department (HOSPITAL_BASED_OUTPATIENT_CLINIC_OR_DEPARTMENT_OTHER): Payer: BLUE CROSS/BLUE SHIELD

## 2015-03-11 ENCOUNTER — Encounter (HOSPITAL_BASED_OUTPATIENT_CLINIC_OR_DEPARTMENT_OTHER): Payer: Self-pay

## 2015-03-11 DIAGNOSIS — R200 Anesthesia of skin: Secondary | ICD-10-CM | POA: Insufficient documentation

## 2015-03-11 DIAGNOSIS — Z8742 Personal history of other diseases of the female genital tract: Secondary | ICD-10-CM | POA: Diagnosis not present

## 2015-03-11 DIAGNOSIS — M79621 Pain in right upper arm: Secondary | ICD-10-CM | POA: Diagnosis not present

## 2015-03-11 DIAGNOSIS — F410 Panic disorder [episodic paroxysmal anxiety] without agoraphobia: Secondary | ICD-10-CM | POA: Diagnosis not present

## 2015-03-11 DIAGNOSIS — Z79899 Other long term (current) drug therapy: Secondary | ICD-10-CM | POA: Diagnosis not present

## 2015-03-11 DIAGNOSIS — R079 Chest pain, unspecified: Secondary | ICD-10-CM | POA: Diagnosis not present

## 2015-03-11 DIAGNOSIS — R0602 Shortness of breath: Secondary | ICD-10-CM | POA: Insufficient documentation

## 2015-03-11 DIAGNOSIS — R110 Nausea: Secondary | ICD-10-CM | POA: Insufficient documentation

## 2015-03-11 LAB — BASIC METABOLIC PANEL
Anion gap: 11 (ref 5–15)
BUN: 15 mg/dL (ref 6–20)
CO2: 25 mmol/L (ref 22–32)
Calcium: 9.1 mg/dL (ref 8.9–10.3)
Chloride: 106 mmol/L (ref 101–111)
Creatinine, Ser: 0.61 mg/dL (ref 0.44–1.00)
Glucose, Bld: 128 mg/dL — ABNORMAL HIGH (ref 65–99)
POTASSIUM: 3.6 mmol/L (ref 3.5–5.1)
SODIUM: 142 mmol/L (ref 135–145)

## 2015-03-11 LAB — CBC WITH DIFFERENTIAL/PLATELET
BASOS ABS: 0 10*3/uL (ref 0.0–0.1)
BASOS PCT: 1 %
EOS ABS: 0.1 10*3/uL (ref 0.0–0.7)
Eosinophils Relative: 1 %
HCT: 38.8 % (ref 36.0–46.0)
HEMOGLOBIN: 12.9 g/dL (ref 12.0–15.0)
LYMPHS ABS: 1.5 10*3/uL (ref 0.7–4.0)
LYMPHS PCT: 27 %
MCH: 31.2 pg (ref 26.0–34.0)
MCHC: 33.2 g/dL (ref 30.0–36.0)
MCV: 93.7 fL (ref 78.0–100.0)
MONOS PCT: 8 %
Monocytes Absolute: 0.4 10*3/uL (ref 0.1–1.0)
NEUTROS ABS: 3.6 10*3/uL (ref 1.7–7.7)
NEUTROS PCT: 63 %
PLATELETS: 311 10*3/uL (ref 150–400)
RBC: 4.14 MIL/uL (ref 3.87–5.11)
RDW: 12.4 % (ref 11.5–15.5)
WBC: 5.6 10*3/uL (ref 4.0–10.5)

## 2015-03-11 LAB — TROPONIN I

## 2015-03-11 MED ORDER — ASPIRIN 81 MG PO CHEW
324.0000 mg | CHEWABLE_TABLET | Freq: Once | ORAL | Status: AC
  Administered 2015-03-11: 324 mg via ORAL
  Filled 2015-03-11: qty 4

## 2015-03-11 MED ORDER — PREDNISONE 50 MG PO TABS
60.0000 mg | ORAL_TABLET | Freq: Once | ORAL | Status: AC
  Administered 2015-03-11: 60 mg via ORAL
  Filled 2015-03-11: qty 1

## 2015-03-11 MED ORDER — SODIUM CHLORIDE 0.9 % IV SOLN
INTRAVENOUS | Status: DC
  Administered 2015-03-11: 08:00:00 via INTRAVENOUS

## 2015-03-11 MED ORDER — PREDNISONE 10 MG PO TABS: 40.0000 mg | ORAL_TABLET | Freq: Every day | ORAL | Status: DC

## 2015-03-11 MED ORDER — ONDANSETRON HCL 4 MG/2ML IJ SOLN
4.0000 mg | Freq: Once | INTRAMUSCULAR | Status: AC
  Administered 2015-03-11: 4 mg via INTRAVENOUS
  Filled 2015-03-11: qty 2

## 2015-03-11 MED FILL — predniSONE 10 MG TABS: 10 | 5 days supply | Qty: 20 | Fill #0

## 2015-03-11 NOTE — ED Notes (Signed)
Pt c/o intermittent right shoulder pain, right clavicular pain and axilla pain for the last month.  With the pain she has tingling that shoots down her arm as well.  She comes in this morning because she didn't sleep at all last night.

## 2015-03-11 NOTE — Discharge Instructions (Signed)
Take the prednisone as directed. Follow-up with your Dr. make an appointment. May need MRI of neck for further evaluation of a pinched nerve in the neck. Also may consider of further evaluation for the chest pain. Today's workup without any acute findings.

## 2015-03-11 NOTE — ED Provider Notes (Signed)
CSN: [id number]     Arrival date & time 03/11/15  [id number] History   First MD Initiated Contact with Patient 03/11/15 [phone]     Chief Complaint  Patient presents with  . Shoulder Pain     (Consider location/radiation/quality/duration/timing/severity/associated sxs/prior Treatment) Patient is a 40 y.o. female presenting with shoulder pain. The history is provided by the patient and the spouse.  Shoulder Pain Associated symptoms: no back pain, no fever and no neck pain   Patient with complaint of right-sided chest pain and axillary pain intermittently for about a month. Would last 15 or 20 minutes. Starting 1-2 weeks ago started to radiate into the right arm with pain at the elbow and numbness to the last 2 fingers. Sometimes the pain now will radiate to the posterior part of the shoulder as well. Pain became constant last night at midnight. Associated with some mild shortness of breath and maybe some mild nausea.  Past Medical History  Diagnosis Date  . Panic attacks   . Allergy   . Endometriosis     dx 2007 at time of surhery  . Contraception     husband vasectomy  . Vaginal septum     [name], OB/GYN   Past Surgical History  Procedure Laterality Date  . Cesarean section    . Cholecystectomy    . Ovarian cyst removal Left 2007   Family History  Problem Relation Age of Onset  . Cancer Mother   . Heart disease Father   . Depression Father    Social History  Substance Use Topics  . Smoking status: Never Smoker   . Smokeless tobacco: None  . Alcohol Use: No   OB History    No data available     Review of Systems  Constitutional: Negative for fever.  HENT: Negative for congestion.   Eyes: Negative for redness.  Respiratory: Positive for shortness of breath.   Cardiovascular: Positive for chest pain.  Gastrointestinal: Positive for nausea. Negative for vomiting and abdominal pain.  Genitourinary: Negative for dysuria.  Musculoskeletal: Negative for back pain and neck  pain.  Skin: Negative for rash.  Neurological: Positive for numbness. Negative for weakness.  Hematological: Does not bruise/bleed easily.  Psychiatric/Behavioral: Negative for confusion. The patient is nervous/anxious.       Allergies  Hydrocodone-acetaminophen and Demerol  Home Medications   Prior to Admission medications   Medication Sig Start Date End Date Taking? Authorizing Provider  clonazePAM (KLONOPIN) 0.5 MG tablet Take 1 tablet (0.5 mg total) by mouth 2 (two) times daily as needed for anxiety. 07/13/14   Colon Branch, MD  dicyclomine (BENTYL) 10 MG capsule Take 1 capsule (10 mg total) by mouth 4 (four) times daily as needed for spasms. 01/29/15   Colon Branch, MD  Lactobacillus-Inulin (CULTURELLE DIGESTIVE HEALTH PO) Take 1 tablet by mouth daily.    Historical Provider, MD  loratadine (CLARITIN) 10 MG tablet Take 1 tablet (10 mg total) by mouth daily. 07/13/14   Colon Branch, MD  Multiple Vitamins-Minerals (WOMENS MULTI VITAMIN & MINERAL PO) Take by mouth. Reported on 01/29/2015    Historical Provider, MD  pantoprazole (PROTONIX) 40 MG tablet Take 1 tablet (40 mg total) by mouth daily. 01/29/15   Colon Branch, MD  predniSONE (DELTASONE) 10 MG tablet Take 4 tablets (40 mg total) by mouth daily. 03/11/15   Fredia Sorrow, MD   BP 100/57 mmHg  Pulse 75  Temp(Src) 98.1 F (36.7 C) (Oral)  Resp 18  Ht 5\' 6"  (1.676 m)  Wt 72.576 kg  BMI 25.84 kg/m2  SpO2 99%  LMP 03/03/2015 Physical Exam  Constitutional: She is oriented to person, place, and time. She appears well-developed and well-nourished. No distress.  HENT:  Head: Normocephalic and atraumatic.  Mouth/Throat: Oropharynx is clear and moist.  Eyes: Conjunctivae and EOM are normal. Pupils are equal, round, and reactive to light.  Neck: Normal range of motion.  Cardiovascular: Normal rate, regular rhythm and normal heart sounds.   No murmur heard. Pulmonary/Chest: Effort normal and breath sounds normal. No respiratory distress.   Abdominal: Soft. Bowel sounds are normal. There is no tenderness.  Musculoskeletal: Normal range of motion. She exhibits no tenderness.  Right radial pulse 2+. Sensation intact to fingers but patient states that the fourth and fifth finger feels less sensitive and more numb. Good range of motion at the wrist fingers elbow and shoulder without any reproducible pain. No neck tenderness.  Neurological: She is alert and oriented to person, place, and time. No cranial nerve deficit. She exhibits normal muscle tone. Coordination normal.  Nursing note and vitals reviewed.   ED Course  Procedures (including critical care time) Labs Review Labs Reviewed  BASIC METABOLIC PANEL - Abnormal; Notable for the following:    Glucose, Bld 128 (*)    All other components within normal limits  TROPONIN I  CBC WITH DIFFERENTIAL/PLATELET  TROPONIN I    Imaging Review Dg Chest 2 View  03/11/2015  CLINICAL DATA:  Intermittent right shoulder and axillary pain for 1 month. History of endometriosis and anxiety. EXAM: CHEST  2 VIEW COMPARISON:  Radiographs 07/13/2014 and 05/30/2012. FINDINGS: The heart size and mediastinal contours are normal. The lungs are clear. There is no pleural effusion or pneumothorax. No acute osseous findings are identified. IMPRESSION: Stable chest.  No active cardiopulmonary process. Electronically Signed   By: Richardean Sale M.D.   On: 03/11/2015 07:50   I have personally reviewed and evaluated these images and lab results as part of my medical decision-making.   EKG Interpretation   Date/Time:  Thursday March 11 2015 07:37:23 EST Ventricular Rate:  77 PR Interval:  166 QRS Duration: 78 QT Interval:  383 QTC Calculation: 433 R Axis:   68 Text Interpretation:  Sinus rhythm Low voltage, precordial leads No  previous ECGs available Confirmed by Fleur Audino  MD, Shineka Auble ([id number]) on  03/11/2015 7:46:35 AM Also confirmed by Rogene Houston  MD, Zeplin Aleshire [phone]), editor  [place]  [facility], [place]  ([id number])  on 03/11/2015 11:39:42 AM      MDM   Final diagnoses:  Chest pain, unspecified chest pain type    Patient with a one-month history of intermittent pain to the right part of the chest. Starting 1-2 weeks ago started to radiate to the right arm. With numbness to the last 2 fingers. Pain also in the elbow. Pain when it was intermittent and would last 15-20 minutes. But it has been constant since midnight.   Troponins 2 are negative. Unlikely to be an acute cardiac event. Chest x-rays negative for pneumothorax pneumonia or pulmonary edema. Labs without significant and a mallet. EKG without any acute findings.    Symptoms could be related to a pinched nerve in the neck based on the numbness in the last 2 fingers. Will treat with a trial of prednisone. Have patient follow-up with her regular doctor. Further evaluation of the pain in the right breast would be appropriate. Also consideration for further evaluation of the chest pain through cardiology  or outpatient workup. Also would consider MRI of the neck if symptoms persist. Patient does have a cardiac risk factor with the father who had heart problems prior to age 34.   Clinically not concerned about pulmonary embolus the duration of the symptom. Oxygensaturation very normal.   Fredia Sorrow, MD 03/11/15 1226

## 2015-03-29 ENCOUNTER — Other Ambulatory Visit: Payer: Self-pay | Admitting: Internal Medicine

## 2015-03-29 DIAGNOSIS — Z1231 Encounter for screening mammogram for malignant neoplasm of breast: Secondary | ICD-10-CM

## 2015-03-30 ENCOUNTER — Ambulatory Visit
Admission: RE | Admit: 2015-03-30 | Discharge: 2015-03-30 | Disposition: A | Discharge status: Home / Self Care | Payer: BLUE CROSS/BLUE SHIELD | Source: Ambulatory Visit | Attending: Internal Medicine | Admitting: Internal Medicine

## 2015-03-30 ENCOUNTER — Other Ambulatory Visit: Payer: Self-pay | Admitting: Internal Medicine

## 2015-03-30 DIAGNOSIS — Z1231 Encounter for screening mammogram for malignant neoplasm of breast: Secondary | ICD-10-CM | POA: Insufficient documentation

## 2015-06-07 ENCOUNTER — Ambulatory Visit (INDEPENDENT_AMBULATORY_CARE_PROVIDER_SITE_OTHER): Payer: BLUE CROSS/BLUE SHIELD | Admitting: Internal Medicine

## 2015-06-07 ENCOUNTER — Encounter: Payer: Self-pay | Admitting: Internal Medicine

## 2015-06-07 VITALS — BP 124/72 | HR 77 | Temp 98.0°F | Ht 66.0 in | Wt 163.2 lb

## 2015-06-07 DIAGNOSIS — M542 Cervicalgia: Secondary | ICD-10-CM

## 2015-06-07 DIAGNOSIS — R1010 Upper abdominal pain, unspecified: Secondary | ICD-10-CM

## 2015-06-07 DIAGNOSIS — F411 Generalized anxiety disorder: Secondary | ICD-10-CM

## 2015-06-07 MED ORDER — PANTOPRAZOLE SODIUM 40 MG PO TBEC: 40.0000 mg | DELAYED_RELEASE_TABLET | Freq: Every day | ORAL | Status: DC

## 2015-06-07 MED ORDER — CLONAZEPAM 0.5 MG PO TABS: 0.5000 mg | ORAL_TABLET | Freq: Two times a day (BID) | ORAL | Status: DC | PRN

## 2015-06-07 NOTE — Patient Instructions (Signed)
GO TO THE LAB : Get the blood work     GO TO THE FRONT DESK Schedule your next appointment for a  checkup in 3 months  Decrease pantoprazole to one tablet every other day

## 2015-06-07 NOTE — Progress Notes (Signed)
Pre visit review using our clinic review tool, if applicable. No additional management support is needed unless otherwise documented below in the visit note. 

## 2015-06-07 NOTE — Progress Notes (Signed)
Subjective:    Patient ID: Alicia Duffy, female    DOB: 1975-05-21, 40 y.o.   MRN: [id number]  DOS:  06/07/2015 Type of visit - description :  Acute visit Having sx x  several months ago, all of them right-sided: Pain at the right anterior neck, feels like a lump in there, no mass on self palpation. Also now the discomfort is felt on the R jaw, behind the right ear and the right side of the head. Not described as a headache per se. She wonders if it is related to her thyroid.  Because of the symptoms she is very anxious, states that on regular basis she's not anxious or depressed is only when she has physical symptoms.  Upper abdominal pain, see previous visit, on PPIs, symptoms subsided.   Review of Systems    No fever chills, no recent URI or nosebleeds. No GERD per se, no dysphagia or odynophagia.  Past Medical History  Diagnosis Date  . Panic attacks   . Allergy   . Endometriosis     dx 2007 at time of surhery  . Contraception     husband vasectomy  . Vaginal septum     [name], OB/GYN    Past Surgical History  Procedure Laterality Date  . Cesarean section    . Cholecystectomy    . Ovarian cyst removal Left 2007    Social History   Social History  . Marital Status: Married    Spouse Name: N/A  . Number of Children: 2  . Years of Education: N/A   Occupational History  . works part time     Social History Main Topics  . Smoking status: Never Smoker   . Smokeless tobacco: Not on file  . Alcohol Use: No  . Drug Use: No  . Sexual Activity: Yes   Other Topics Concern  . Not on file   Social History Narrative        Medication List       This list is accurate as of: 06/07/15 11:59 PM.  Always use your most recent med list.               clonazePAM 0.5 MG tablet  Commonly known as:  KLONOPIN  Take 1 tablet (0.5 mg total) by mouth 2 (two) times daily as needed for anxiety.     CULTURELLE DIGESTIVE HEALTH PO  Take 1 tablet by mouth  daily.     loratadine 10 MG tablet  Commonly known as:  CLARITIN  Take 1 tablet (10 mg total) by mouth daily.     pantoprazole 40 MG tablet  Commonly known as:  PROTONIX  Take 1 tablet (40 mg total) by mouth daily.     WOMENS MULTI VITAMIN & MINERAL PO  Take by mouth. Reported on 06/07/2015           Objective:   Physical Exam BP 124/72 mmHg  Pulse 77  Temp(Src) 98 F (36.7 C) (Oral)  Ht 5\' 6"  (1.676 m)  Wt 163 lb 4 oz (74.05 kg)  BMI 26.36 kg/m2  SpO2 99%  LMP 06/03/2015 (Approximate)       General:   Well developed, well nourished . NAD.  HEENT:  Normocephalic . Face symmetric, atraumatic. TMs normal, throat symmetric without redness. [place] normal. Neck: No thyromegaly, LAD or mass.. No TMJ click or pain Lungs:  CTA B Normal respiratory effort, no intercostal retractions, no accessory muscle use. Heart: RRR,  no murmur.  No pretibial edema bilaterally  Skin: Not pale. Not jaundice Neurologic:  alert & oriented X3.  Speech normal, gait appropriate for age and unassisted Psych--  Cognition and judgment appear intact.  Cooperative with normal attention span and concentration.  Behavior appropriate. Slightly anxious but no depressed appearing.     Assessment & Plan:    Assessment Anxiety, panic attacks GI:  (most GI history per pt) --H pylory 2014, blood test, was rx abx, f/u breath test was (-) --IBS: Saw GI 2015, Dr Nicoletta Dress [facility]),  EGD wnl, no H Pylory, had a US-HIDA, saw surgery, GB removed, eventually dx w/ IBS GYN:  --endometriosis, Vaginal septum --Contraception: Husband vasectomy  PLAN: Neck pain: as described above: Etiology unclear, exam is normal, TMJ? GERD? (Sx  controlled), globus? Will check a TSH sedimentation rate, if negative observation, Discussed ENT referral, but pt decided to wait, will call if sx persist  Upper right-sided abdominal pain: Since the last visit he started PPIs, sx essentially gone. Recommend to decrease PPIs to  every other day Anxiety: Continue with anxiety, does not have sxs regular basis only when she has physical symptoms. For now we agreed to continue with clonazepam as needed. Contract and UDS today. RTC 3 months

## 2015-06-08 LAB — SEDIMENTATION RATE: Sed Rate: 1 mm/hr (ref 0–20)

## 2015-06-08 LAB — TSH: TSH: 1.9 u[IU]/mL (ref 0.35–4.50)

## 2015-06-10 NOTE — Assessment & Plan Note (Signed)
Neck pain: as described above: Etiology unclear, exam is normal, TMJ? GERD? (Sx  controlled), globus? Will check a TSH sedimentation rate, if negative observation, Discussed ENT referral, but pt decided to wait, will call if sx persist  Upper right-sided abdominal pain: Since the last visit he started PPIs, sx essentially gone. Recommend to decrease PPIs to every other day Anxiety: Continue with anxiety, does not have sxs regular basis only when she has physical symptoms. For now we agreed to continue with clonazepam as needed. Contract and UDS today. RTC 3 months

## 2015-07-01 ENCOUNTER — Telehealth: Payer: Self-pay

## 2015-07-01 NOTE — Telephone Encounter (Signed)
UDS: 06/07/2015  Negative for Clonazepam: PRN   Low risk per Dr. Larose Kells 07/01/2015

## 2015-07-09 ENCOUNTER — Telehealth: Payer: Self-pay | Admitting: Internal Medicine

## 2015-07-13 ENCOUNTER — Encounter: Payer: Self-pay | Admitting: Internal Medicine

## 2015-07-21 NOTE — Telephone Encounter (Signed)
Completed.

## 2015-09-08 ENCOUNTER — Ambulatory Visit: Payer: BLUE CROSS/BLUE SHIELD | Admitting: Internal Medicine

## 2015-09-27 ENCOUNTER — Telehealth: Payer: Self-pay | Admitting: Internal Medicine

## 2015-09-27 DIAGNOSIS — M542 Cervicalgia: Secondary | ICD-10-CM

## 2015-09-27 NOTE — Telephone Encounter (Signed)
Informed patient that referral had been placed. Patient states she will call back to the office to schedule her 3 month follow up

## 2015-09-27 NOTE — Telephone Encounter (Signed)
Referral has been placed. Pt is overdue for 3 month follow-up regarding anxiety. Please schedule at her convenience.

## 2015-09-27 NOTE — Telephone Encounter (Signed)
Neck pain: as described above: Etiology unclear, exam is normal, TMJ? GERD? (Sx  controlled), globus? Will check a TSH sedimentation rate, if negative observation, Discussed ENT referral, but pt decided to wait, will call if sx persist  Upper right-sided abdominal pain: Since the last visit he started PPIs, sx essentially gone. Recommend to decrease PPIs to every other day Anxiety: Continue with anxiety, does not have sxs regular basis only when she has physical symptoms. For now we agreed to continue with clonazepam as needed. Contract and UDS today. RTC 3 months   ENT referral placed.

## 2015-09-27 NOTE — Telephone Encounter (Signed)
Caller name: Relationship to patient: Self Can be reached: [phone] Pharmacy:  Reason for call: Request referral to ENT. States that provider informed her to call if she wanted to be seen by ENT

## 2016-03-03 IMAGING — DX DG CHEST 2V
2 series · 2 of 2 positions shown · non-contrast
Comparison: None.

CLINICAL DATA: Left chest pressure like pain for the past 5 days
with radiation down the left arm, no cough, respiratory distress, or
known injury

EXAM:
CHEST  2 VIEW

[chest pa]
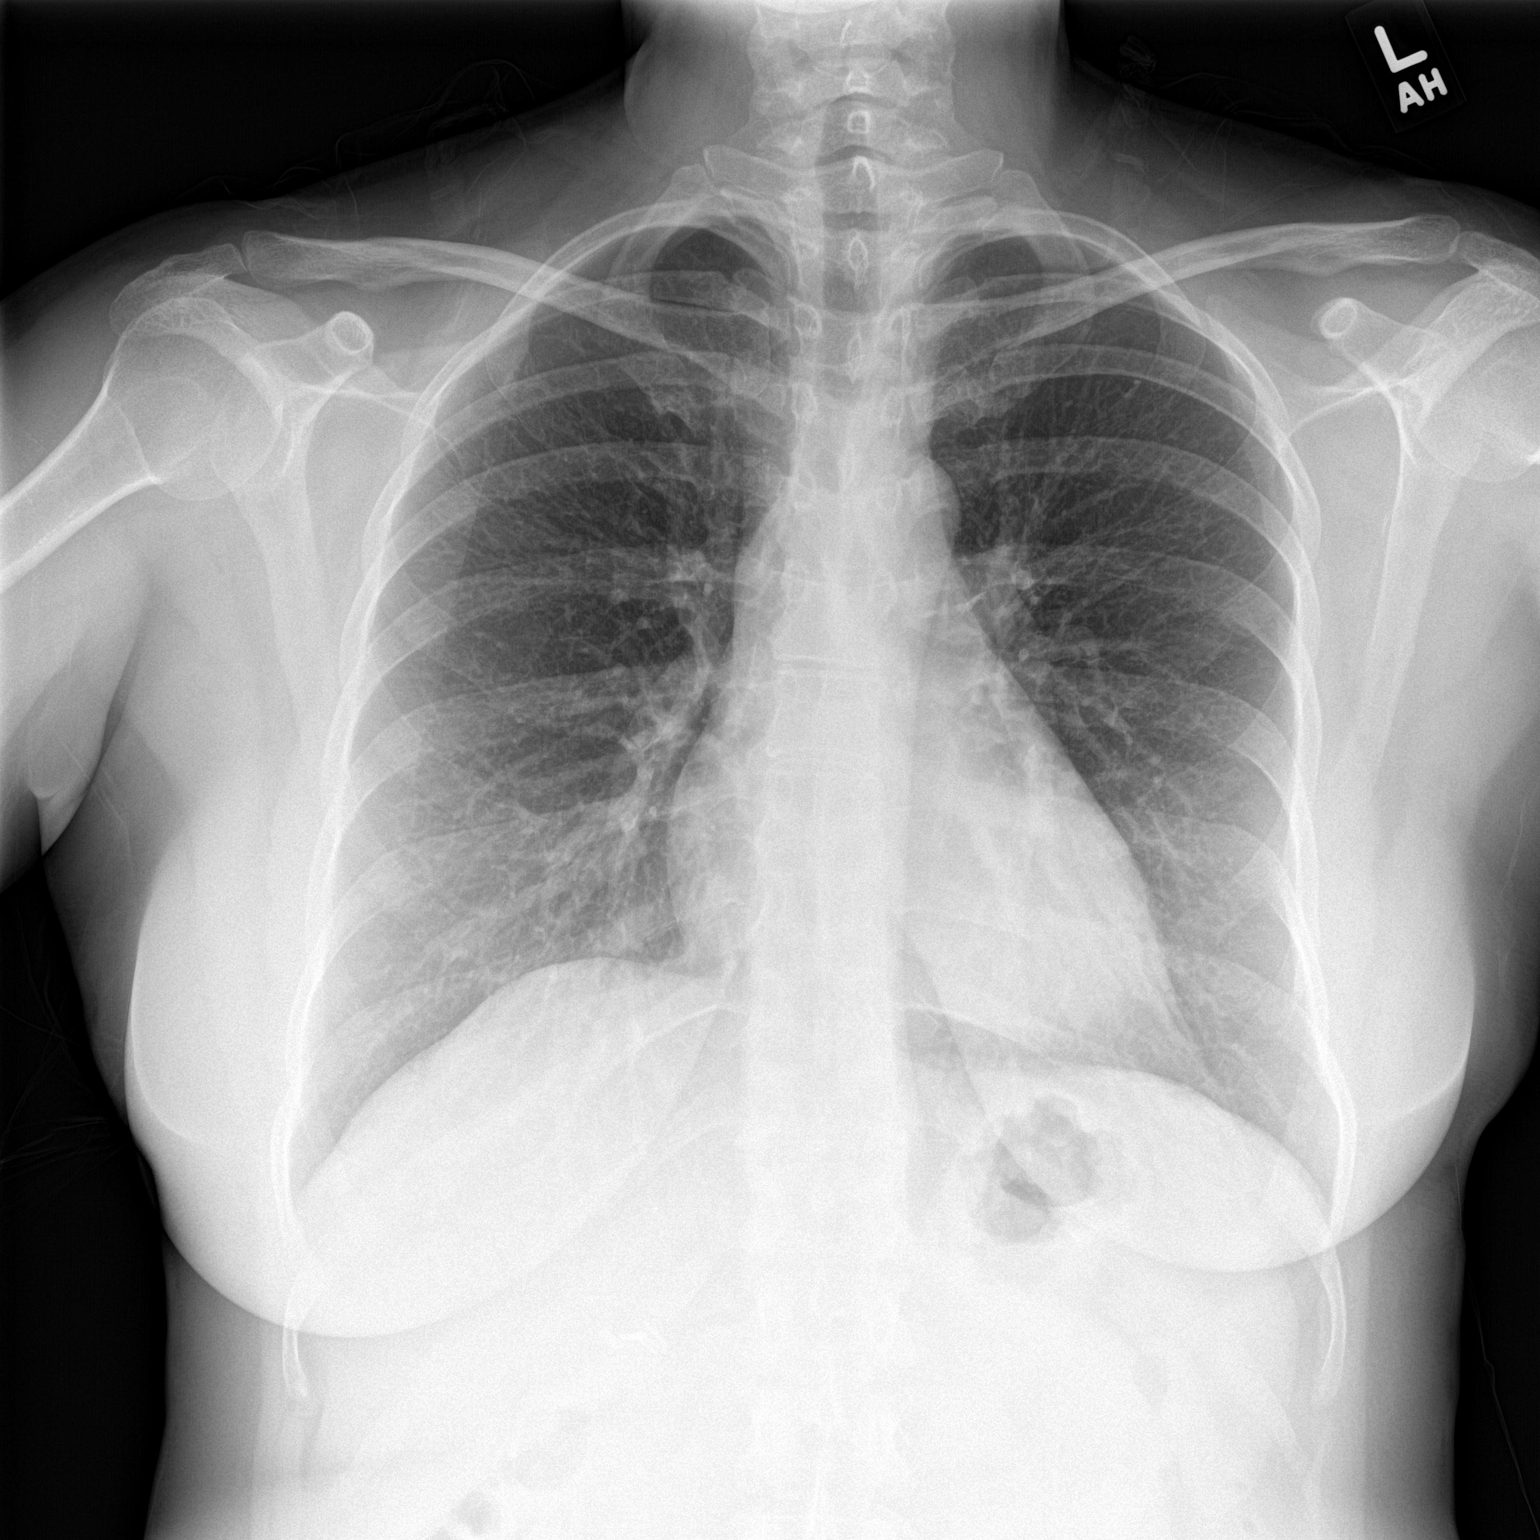

[chest lat]
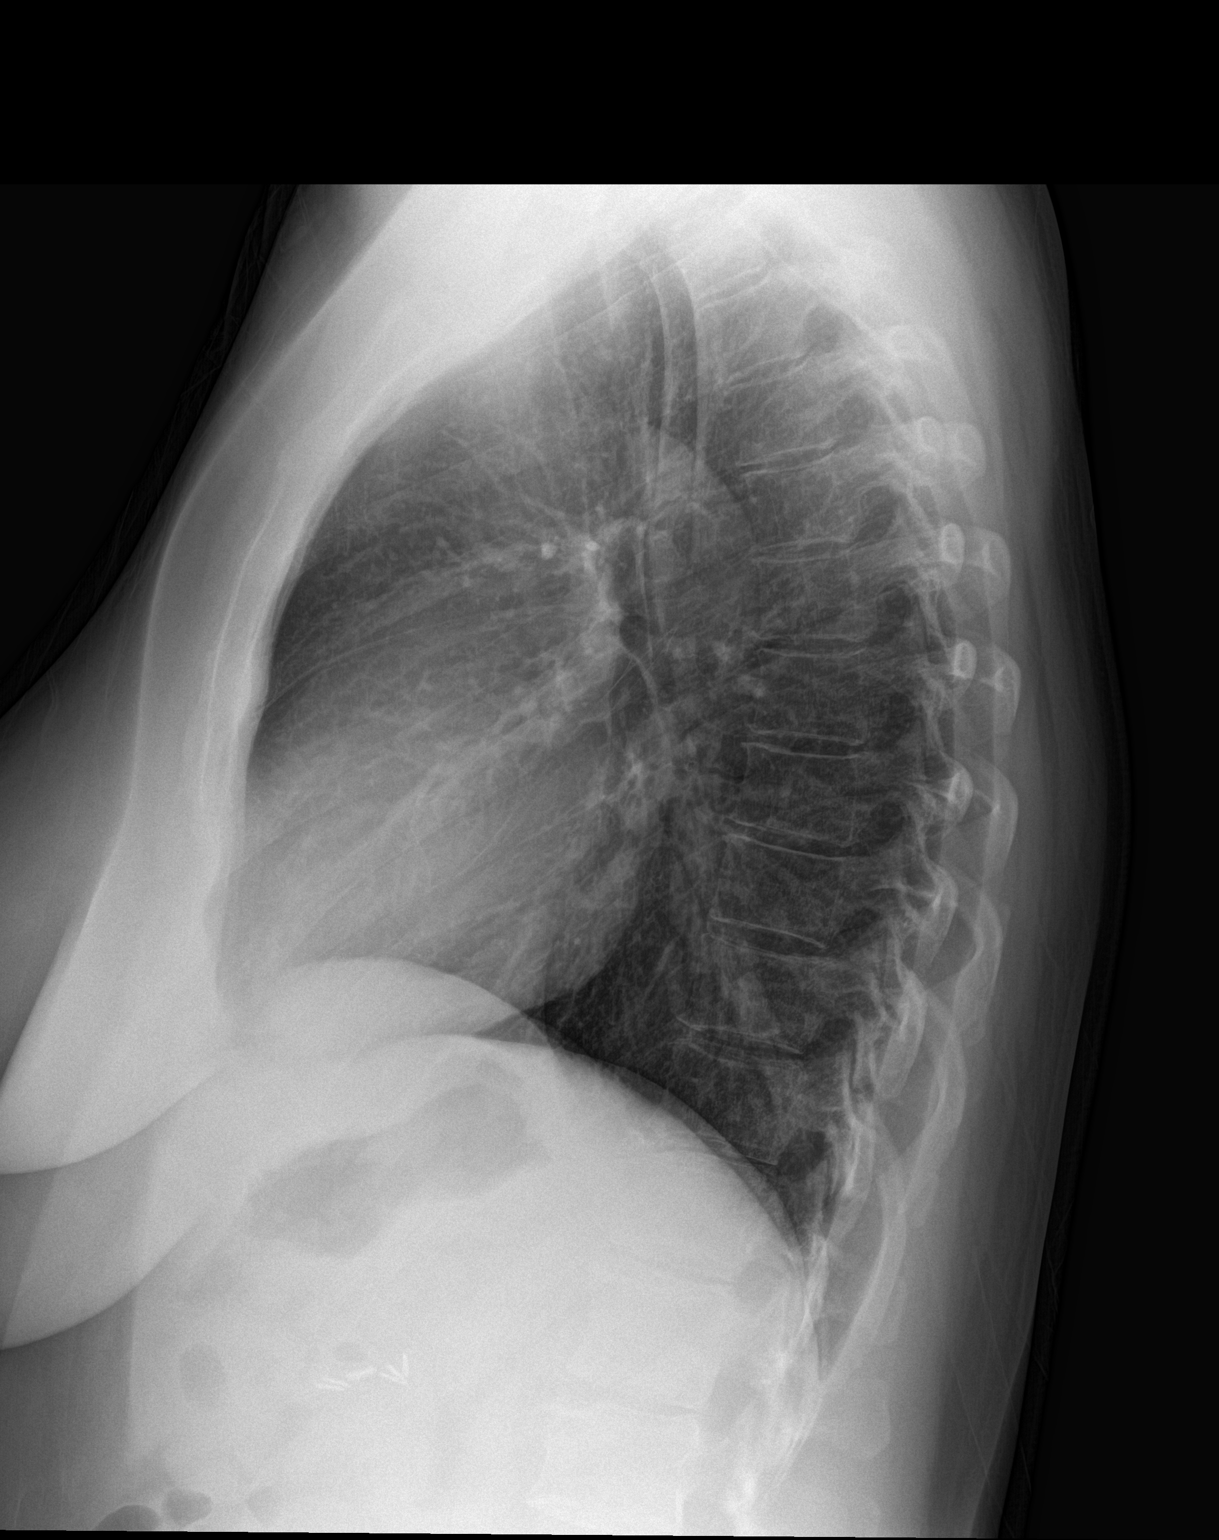

[2 of 2 positions shown; findings below may reference images not displayed]

FINDINGS: The lungs are adequately inflated and clear. The heart and pulmonary
vascularity are normal. The mediastinum is normal in width. There is
no pleural effusion or pneumothorax. The bony thorax is
unremarkable. The trachea is midline. The bony thorax exhibits no
acute abnormality.
IMPRESSION: There is no active cardiopulmonary disease.

## 2016-03-07 ENCOUNTER — Other Ambulatory Visit: Payer: Self-pay | Admitting: Family Medicine

## 2016-03-07 DIAGNOSIS — Z1231 Encounter for screening mammogram for malignant neoplasm of breast: Secondary | ICD-10-CM

## 2016-04-04 ENCOUNTER — Ambulatory Visit
Admission: RE | Admit: 2016-04-04 | Discharge: 2016-04-04 | Disposition: A | Discharge status: Home / Self Care | Payer: BLUE CROSS/BLUE SHIELD | Source: Ambulatory Visit | Attending: Family Medicine | Admitting: Family Medicine

## 2016-04-04 DIAGNOSIS — Z1231 Encounter for screening mammogram for malignant neoplasm of breast: Secondary | ICD-10-CM | POA: Diagnosis not present

## 2016-04-06 ENCOUNTER — Ambulatory Visit: Payer: BLUE CROSS/BLUE SHIELD | Admitting: Internal Medicine

## 2016-04-12 ENCOUNTER — Other Ambulatory Visit: Payer: Self-pay | Admitting: Family Medicine

## 2016-04-12 DIAGNOSIS — M545 Low back pain: Secondary | ICD-10-CM

## 2016-04-12 DIAGNOSIS — R1011 Right upper quadrant pain: Secondary | ICD-10-CM

## 2016-04-18 ENCOUNTER — Ambulatory Visit: Payer: BLUE CROSS/BLUE SHIELD

## 2016-08-15 ENCOUNTER — Telehealth: Payer: Self-pay | Admitting: Genetic Counselor

## 2016-08-15 NOTE — Telephone Encounter (Signed)
Patient wants [place] to CB in a week as she is going on vacation starting today.

## 2016-08-24 ENCOUNTER — Telehealth: Payer: Self-pay | Admitting: Genetic Counselor

## 2016-08-24 NOTE — Telephone Encounter (Signed)
LM on VM that I was trying to reach her to set up an appointment.  Asked that she please call back.

## 2017-03-28 ENCOUNTER — Other Ambulatory Visit: Payer: Self-pay | Admitting: Family Medicine

## 2017-03-28 DIAGNOSIS — Z1231 Encounter for screening mammogram for malignant neoplasm of breast: Secondary | ICD-10-CM

## 2017-04-11 ENCOUNTER — Ambulatory Visit
Admission: RE | Admit: 2017-04-11 | Discharge: 2017-04-11 | Disposition: A | Discharge status: Home / Self Care | Payer: BLUE CROSS/BLUE SHIELD | Source: Ambulatory Visit | Attending: Family Medicine | Admitting: Family Medicine

## 2017-04-11 DIAGNOSIS — Z1231 Encounter for screening mammogram for malignant neoplasm of breast: Secondary | ICD-10-CM | POA: Insufficient documentation

## 2017-08-01 ENCOUNTER — Encounter: Payer: Self-pay | Admitting: *Deleted

## 2017-08-01 DIAGNOSIS — Z8249 Family history of ischemic heart disease and other diseases of the circulatory system: Secondary | ICD-10-CM | POA: Insufficient documentation

## 2017-08-01 HISTORY — DX: Family history of ischemic heart disease and other diseases of the circulatory system: Z82.49

## 2017-08-06 ENCOUNTER — Encounter: Payer: Self-pay | Admitting: Cardiology

## 2017-08-06 ENCOUNTER — Ambulatory Visit: Payer: BLUE CROSS/BLUE SHIELD | Admitting: Cardiology

## 2017-08-06 VITALS — BP 100/62 | HR 64 | Ht 63.0 in | Wt 134.1 lb

## 2017-08-06 DIAGNOSIS — R0609 Other forms of dyspnea: Secondary | ICD-10-CM

## 2017-08-06 HISTORY — DX: Other forms of dyspnea: R06.09

## 2017-08-06 NOTE — Progress Notes (Signed)
stres

## 2017-08-06 NOTE — Progress Notes (Signed)
Cardiology Office Note:    Date:  08/06/2017   ID:  Alicia Duffy, DOB 09-12-1975, MRN [id number]  PCP:  Karen Kitchens, MD  Cardiologist:  Jenean Lindau, MD   Referring MD: Karen Kitchens, MD    ASSESSMENT:    1. Dyspnea on exertion    PLAN:    In order of problems listed above:  1. Primary prevention stressed with patient.  Importance of compliance with diet and medication stressed and she vocalized understanding.  Lifestyle modifications were explained.  Her blood pressure stable, matter-of-fact it is on the lower side and adequate hydration was advised to the patient.  She vocalized understanding.  She complains of some fatigue at times.  She also has issues with anemia managed by her primary care physician. 2. Lipids were dated and diet was explained and she vocalized understanding.  She is overall an active lady.  She has an element of anxiety and she herself confessses.  In view of this I will do exercise stress echo to assess her symptoms. 3. She knows to go to the nearest emergency room for any significant concerns.  She will be seen in follow-up appointment on a as needed basis.   Medication Adjustments/Labs and Tests Ordered: Current medicines are reviewed at length with the patient today.  Concerns regarding medicines are outlined above.  Orders Placed This Encounter  Procedures  . ECHOCARDIOGRAM STRESS TEST   No orders of the defined types were placed in this encounter.    History of Present Illness:    Alicia Duffy is a 42 y.o. female who is being seen today for the evaluation of shortness of breath on exertion and occasional palpitations at the request of Karen Kitchens, MD.  Patient is a pleasant 42 year old female.  She has no significant past medical history.  She has issues with anxiety.  She denies any problems at this time and takes care of activities of daily living.  No chest pain orthopnea or PND.  She tells me that but  whenever she worries she has some chest tightness.  She is active and walks some on a regular basis.  Her LDL is elevated based on the lipid review.  At the time of my evaluation, the patient is alert awake oriented and in no distress.  Past Medical History:  Diagnosis Date  . Allergy   . Anemia    d/t heavy menses  . Contraception    husband vasectomy  . Endometriosis    dx 2007 at time of surhery  . GERD (gastroesophageal reflux disease)   . Panic attacks   . Vaginal septum    [name], OB/GYN    Past Surgical History:  Procedure Laterality Date  . CESAREAN SECTION    . CHOLECYSTECTOMY    . OVARIAN CYST REMOVAL Left 2007    Current Medications: Current Meds  Medication Sig  . FERROUS SULFATE PO Take 500 mg by mouth 2 (two) times daily with a meal.  . pantoprazole (PROTONIX) 40 MG tablet Take 40 mg by mouth 2 (two) times daily.  Pemble Kitchen UNABLE TO FIND daily. Med Name: Cultrelle     Allergies:   Hydrocodone-acetaminophen and Demerol [meperidine]   Social History   Socioeconomic History  . Marital status: Married    Spouse name: Not on file  . Number of children: 2  . Years of education: Not on file  . Highest education level: Not on file  Occupational History  . Occupation: works  part time   Social Needs  . Financial resource strain: Not on file  . Food insecurity:    Worry: Not on file    Inability: Not on file  . Transportation needs:    Medical: Not on file    Non-medical: Not on file  Tobacco Use  . Smoking status: Never Smoker  . Smokeless tobacco: Never Used  Substance and Sexual Activity  . Alcohol use: No  . Drug use: No  . Sexual activity: Yes  Lifestyle  . Physical activity:    Days per week: Not on file    Minutes per session: Not on file  . Stress: Not on file  Relationships  . Social connections:    Talks on phone: Not on file    Gets together: Not on file    Attends religious service: Not on file    Active member of club or  organization: Not on file    Attends meetings of clubs or organizations: Not on file    Relationship status: Not on file  Other Topics Concern  . Not on file  Social History Narrative  . Not on file     Family History: The patient's family history includes Cancer in her mother; Depression in her father; Heart attack in her brother, brother, father, and paternal grandfather; Heart disease in her father. There is no history of Breast cancer.  ROS:   Please see the history of present illness.    All other systems reviewed and are negative.  EKGs/Labs/Other Studies Reviewed:    The following studies were reviewed today: EKG reveals sinus rhythm and nonspecific ST-T changes.   Recent Labs: No results found for requested labs within last 8760 hours.  Recent Lipid Panel No results found for: CHOL, TRIG, HDL, CHOLHDL, VLDL, LDLCALC, LDLDIRECT  Physical Exam:    VS:  BP 100/62 (BP Location: Left Arm, Patient Position: Sitting, Cuff Size: Normal)   Pulse 64   Ht 5\' 3"  (1.6 m)   Wt 134 lb 1.6 oz (60.8 kg)   LMP 07/30/2017   SpO2 99%   BMI 23.75 kg/m     Wt Readings from Last 3 Encounters:  08/06/17 134 lb 1.6 oz (60.8 kg)  06/07/15 163 lb 4 oz (74 kg)  03/11/15 160 lb (72.6 kg)     GEN: Patient is in no acute distress HEENT: Normal NECK: No JVD; No carotid bruits LYMPHATICS: No lymphadenopathy CARDIAC: S1 S2 regular, 2/6 systolic murmur at the apex. RESPIRATORY:  Clear to auscultation without rales, wheezing or rhonchi  ABDOMEN: Soft, non-tender, non-distended MUSCULOSKELETAL:  No edema; No deformity  SKIN: Warm and dry NEUROLOGIC:  Alert and oriented x 3 PSYCHIATRIC:  Normal affect    Signed, Jenean Lindau, MD  08/06/2017 12:15 PM    [facility]

## 2017-08-06 NOTE — Patient Instructions (Signed)
Medication Instructions:  Your physician recommends that you continue on your current medications as directed. Please refer to the Current Medication list given to you today.   Labwork: None  Testing/Procedures: Your physician has requested that you have a stress echocardiogram. For further information please visit [email or web address]. Please follow instruction sheet as given.  Follow-Up: None needed  If you need a refill on your cardiac medications before your next appointment, please call your pharmacy.   Thank you for choosing [facility]! Robyne Peers, RN [phone]     Exercise Stress Electrocardiogram An exercise stress electrocardiogram is a test to check how blood flows to your heart. It is done to find areas of poor blood flow. You will need to walk on a treadmill for this test. The electrocardiogram will record your heartbeat when you are at rest and when you are exercising. What happens before the procedure?  Do not have drinks with caffeine or foods with caffeine for 24 hours before the test, or as told by your doctor. This includes coffee, tea (even decaf tea), sodas, chocolate, and cocoa.  Follow your doctor's instructions about eating and drinking before the test.  Ask your doctor what medicines you should or should not take before the test. Take your medicines with water unless told by your doctor not to.  If you use an inhaler, bring it with you to the test.  Bring a snack to eat after the test.  Do not  smoke for 4 hours before the test.  Do not put lotions, powders, creams, or oils on your chest before the test.  Wear comfortable shoes and clothing. What happens during the procedure?  You will have patches put on your chest. Small areas of your chest may need to be shaved. Wires will be connected to the patches.  Your heart rate will be watched while you are resting and while you are exercising.  You will walk on the treadmill. The  treadmill will slowly get faster to raise your heart rate.  The test will take about 1-2 hours. What happens after the procedure?  Your heart rate and blood pressure will be watched after the test.  You may return to your normal diet, activities, and medicines or as told by your doctor. This information is not intended to replace advice given to you by your health care provider. Make sure you discuss any questions you have with your health care provider. Document Released: 06/07/2007 Document Revised: 08/18/2015 Document Reviewed: 08/26/2012 Elsevier Interactive Patient Education  [employer].

## 2018-03-11 ENCOUNTER — Other Ambulatory Visit: Payer: Self-pay | Admitting: Physician Assistant

## 2018-03-11 DIAGNOSIS — Z1231 Encounter for screening mammogram for malignant neoplasm of breast: Secondary | ICD-10-CM

## 2018-06-26 ENCOUNTER — Ambulatory Visit
Admission: RE | Admit: 2018-06-26 | Discharge: 2018-06-26 | Disposition: A | Discharge status: Home / Self Care | Payer: BC Managed Care – PPO | Source: Ambulatory Visit | Attending: Physician Assistant | Admitting: Physician Assistant

## 2018-06-26 ENCOUNTER — Other Ambulatory Visit: Payer: Self-pay

## 2018-06-26 DIAGNOSIS — Z1231 Encounter for screening mammogram for malignant neoplasm of breast: Secondary | ICD-10-CM | POA: Diagnosis not present

## 2018-09-04 DIAGNOSIS — L820 Inflamed seborrheic keratosis: Secondary | ICD-10-CM | POA: Diagnosis not present

## 2018-10-14 ENCOUNTER — Ambulatory Visit: Payer: 59 | Admitting: Occupational Medicine

## 2018-10-14 ENCOUNTER — Other Ambulatory Visit: Payer: Self-pay

## 2018-10-14 ENCOUNTER — Encounter: Payer: Self-pay | Admitting: Occupational Medicine

## 2018-10-14 VITALS — BP 102/73 | HR 92 | Temp 98.5°F | Resp 12 | Ht 64.0 in | Wt 123.0 lb

## 2018-10-14 DIAGNOSIS — K219 Gastro-esophageal reflux disease without esophagitis: Secondary | ICD-10-CM

## 2018-10-14 MED ORDER — PANTOPRAZOLE SODIUM 40 MG PO TBEC: 40.0000 mg | DELAYED_RELEASE_TABLET | Freq: Every day | ORAL | 3 refills | Status: DC

## 2018-10-14 NOTE — Progress Notes (Signed)
Stress up because of step-father's recent Dx of esophageal cancer.  Requesting Rx refills for Pantoprazole 40 mg 1 tab po bid  - last renewed 12/06/16 by Dr. Corinda Gubler of this office. States she hasn't been taking them, but GERD symptoms due to increased stress.  Clonazepam 0.5 mg 1 tab po bid prn for anxiety. Previously prescribed by Kathlene November, MD of [facility] - previous MD before coming to our office.  States she's been taking medication since 2007.  Has a bottle from 2017 with her - worried they are too old.  AMD

## 2018-10-14 NOTE — Progress Notes (Signed)
Subjective:     Patient ID: Alicia Duffy, female   DOB: Oct 29, 1975, 43 y.o.   MRN: [id number]  HPI We had seen this patient in this clinic for a couple of years.  She presents with anxious complaints of "silent GERD".  In 2016 she was seen by GI and had several endoscopies.  She tells me that she was diagnosed with "silent GERD".  She was given a prescription for Protonix by GI.  She took it regularly until December 2019.  She then discontinued because she did not want to take medicines anymore.  She said recently she has been having an incredible amount of stress in her life due to several family members with cancer and other stressors.  She says lately she has been having a cough at night throat discomfort and some nausea.  She tells me the symptoms are identical to her GERD symptoms in the past.  I offered to refer her back to GI, she declined.  She just wants a refill of her Protonix 40 mg twice a day.  We spent quite some time talking about management of stress.  She also requested a refill for clonazepam.  This was given to her by Dr. Larose Kells a couple of years ago.  She relays a long history of inadequate rate controlled anxiety.  She tells me she has failed Zoloft due to nausea and vomiting.  She has never tried any other SSRIs.  She wants clonazepam.  I also offered to refer her for counseling and even gave her a card for the EAP provider [facility].  She was not interested.  She said she had a bad experience with a counselor in the past.  She did not express any suicidal or homicidal ideations, but I did not ask.  Patient said she was very sensitive to being asked these questions and it made her feel judged and terrible.  Review of Systems Current Outpatient Medications on File Prior to Visit  Medication Sig Dispense Refill  . clonazePAM (KLONOPIN) 0.5 MG tablet Take 0.5 mg by mouth 2 (two) times daily as needed for anxiety.    . Cranberry-Vitamin C 15000-100 MG CAPS Take 1 Dose by mouth daily.     . ferrous sulfate 324 MG TBEC Take 324 mg by mouth daily.    . Lactobacillus-Inulin (CULTURELLE DIGESTIVE HEALTH PO) Take 1 Dose by mouth daily.    Arrambide Kitchen FERROUS SULFATE PO Take 500 mg by mouth 2 (two) times daily with a meal.    . UNABLE TO FIND daily. Med Name: Cultrelle     No current facility-administered medications on file prior to visit.        Objective:   Physical Exam Today's Vitals   10/14/18 1038  BP: 102/73  Pulse: 92  Resp: 12  Temp: 98.5 F (36.9 C)  TempSrc: Oral  SpO2: 98%  Weight: 123 lb (55.8 kg)  Height: 5\' 4"  (1.626 m)   Body mass index is 21.11 kg/m.    Anxious female in no distress.  HEENT normocephalic atraumatic pupils equal round reactive to light oropharynx is clear no anterior cervical adenopathy.  Lungs are clear to auscultation bilaterally.  Heart regular rate and rhythm no murmurs rubs or gallops. Assessment:     Anxiety- patient declined referral for EAP counseling.  She does have some leftover pills from Dr. Larose Kells.  I suggested that a mental health professional would be helpful to her, she did not seem interested.  She certainly does have several situations  that can be stressful.  GERD: I will refill her Protonix 40 mg twice a day.  It seems like there is a mistake in the prescribing that went over to the pharmacy and it looks like it said once a day.  It supposed to be twice a day.  I instructed the patient to come back in if she is no better in a month and we will refer her to GI.    Plan:     See above

## 2019-04-17 ENCOUNTER — Other Ambulatory Visit: Payer: Self-pay

## 2019-04-17 ENCOUNTER — Telehealth: Payer: Self-pay

## 2019-04-17 DIAGNOSIS — K219 Gastro-esophageal reflux disease without esophagitis: Secondary | ICD-10-CM

## 2019-04-17 MED ORDER — PANTOPRAZOLE SODIUM 40 MG PO TBEC: 40.0000 mg | DELAYED_RELEASE_TABLET | Freq: Every day | ORAL | 2 refills | Status: DC

## 2019-04-17 NOTE — Telephone Encounter (Signed)
Received Rx refill request from [facility]) for Pantoprazole sodium 40 mg tablets. Note to Prescriber:  PA for ins if using more than 90 in 365 days  Prior authorization approved through covermymeds 04/17/2019.  AMD

## 2019-05-29 ENCOUNTER — Other Ambulatory Visit: Payer: Self-pay | Admitting: Emergency Medicine

## 2019-05-29 DIAGNOSIS — Z1231 Encounter for screening mammogram for malignant neoplasm of breast: Secondary | ICD-10-CM

## 2019-07-02 ENCOUNTER — Ambulatory Visit
Admission: RE | Admit: 2019-07-02 | Discharge: 2019-07-02 | Disposition: A | Discharge status: Home / Self Care | Payer: 59 | Source: Ambulatory Visit | Attending: Emergency Medicine | Admitting: Emergency Medicine

## 2019-07-02 DIAGNOSIS — Z1231 Encounter for screening mammogram for malignant neoplasm of breast: Secondary | ICD-10-CM | POA: Insufficient documentation

## 2019-07-04 ENCOUNTER — Other Ambulatory Visit: Payer: Self-pay | Admitting: Emergency Medicine

## 2019-07-04 DIAGNOSIS — R928 Other abnormal and inconclusive findings on diagnostic imaging of breast: Secondary | ICD-10-CM

## 2019-07-10 ENCOUNTER — Ambulatory Visit
Admission: RE | Admit: 2019-07-10 | Discharge: 2019-07-10 | Disposition: A | Discharge status: Home / Self Care | Payer: 59 | Source: Ambulatory Visit | Attending: Emergency Medicine | Admitting: Emergency Medicine

## 2019-07-10 DIAGNOSIS — R928 Other abnormal and inconclusive findings on diagnostic imaging of breast: Secondary | ICD-10-CM

## 2019-07-10 DIAGNOSIS — N6489 Other specified disorders of breast: Secondary | ICD-10-CM | POA: Diagnosis not present

## 2019-07-15 ENCOUNTER — Ambulatory Visit: Payer: 59

## 2019-07-15 ENCOUNTER — Other Ambulatory Visit: Payer: 59

## 2019-07-28 ENCOUNTER — Ambulatory Visit: Payer: 59 | Admitting: Emergency Medicine

## 2019-07-28 ENCOUNTER — Other Ambulatory Visit: Payer: Self-pay

## 2019-07-28 ENCOUNTER — Encounter: Payer: Self-pay | Admitting: Emergency Medicine

## 2019-07-28 VITALS — BP 102/75 | HR 89 | Temp 98.6°F | Resp 14 | Ht 64.0 in | Wt 133.0 lb

## 2019-07-28 DIAGNOSIS — K625 Hemorrhage of anus and rectum: Secondary | ICD-10-CM

## 2019-07-28 NOTE — Progress Notes (Signed)
I have reviewed the triage vital signs and the nursing notes.   HISTORY  Chief Complaint Rectal Bleeding   HPI Alicia Duffy is a 44 y.o. female is being seen for rectal bleeding.  Patient states that she has had 2 episodes in the last 5 days in which she saw bright red blood both on the toilet tissue and in the toilet.  Patient denies any previous problems with hemorrhoids.  She reports that she has been under a great deal of stress and that when she is stressed her IBS causes her to have frequent bowel movements/diarrhea.  She denies any tarry black stools or previous ulcers.      Past Medical History:  Diagnosis Date  . Allergy   . Anemia    d/t heavy menses  . Contraception    husband vasectomy  . Endometriosis    dx 2007 at time of surhery  . GERD (gastroesophageal reflux disease)   . Panic attacks   . Vaginal septum    [name], OB/GYN    Patient Active Problem List   Diagnosis Date Noted  . Dyspnea on exertion 08/06/2017  . Family history of MI (myocardial infarction) 08/01/2017  . Chest pain 07/13/2014  . Generalized anxiety disorder 03/05/2014  . Endometriosis 03/05/2014    Past Surgical History:  Procedure Laterality Date  . CESAREAN SECTION    . CHOLECYSTECTOMY    . OVARIAN CYST REMOVAL Left 2007    Prior to Admission medications   Medication Sig Start Date End Date Taking? Authorizing Provider  clonazePAM (KLONOPIN) 0.5 MG tablet Take 0.5 mg by mouth 2 (two) times daily as needed for anxiety.   Yes [provider]  Cranberry-Vitamin C 15000-100 MG CAPS Take 1 Dose by mouth daily.   Yes [provider]  ferrous sulfate 324 MG TBEC Take 324 mg by mouth daily.   Yes [provider]  Lactobacillus-Inulin (Watson PO) Take 1 Dose by mouth daily.   Yes [provider]  pantoprazole (PROTONIX) 40 MG tablet Take 1 tablet (40 mg total) by mouth daily. 04/17/19  Yes Sable Feil, PA-C  FERROUS  SULFATE PO Take 500 mg by mouth 2 (two) times daily with a meal.    [provider]  UNABLE TO FIND daily. Med Name: Cultrelle    [provider]    Allergies Demerol [meperidine] and Hydrocodone-acetaminophen  Family History  Problem Relation Age of Onset  . Cancer Mother   . Heart disease Father   . Depression Father   . Heart attack Father   . Heart attack Brother   . Heart attack Paternal Grandfather   . Heart attack Brother   . Breast cancer Neg Hx     Social History Social History   Tobacco Use  . Smoking status: Never Smoker  . Smokeless tobacco: Never Used  Substance Use Topics  . Alcohol use: No  . Drug use: No    Review of Systems Constitutional: No fever/chills Gastrointestinal: No abdominal pain.  No nausea, no vomiting.  Positive diarrhea.  No constipation. ____________________________________________   PHYSICAL EXAM: Constitutional: Alert and oriented. Well appearing and in no acute distress. Eyes: Conjunctivae are normal.  Head: Atraumatic. Neck: No stridor.  Cardiovascular:  Good peripheral circulation. Respiratory: Normal respiratory effort.  No retractions.  Gastrointestinal: Soft and nontender. No distention.  No external hemorrhoids or fissures are noted.  No evidence of recent bleed.  Rectal exam no masses were appreciated.  Hemoccult  slide showed a extremely faint blue and 1 area that was questionable. Musculoskeletal: Moves upper and lower extremities without difficulty.  Normal gait was noted. Neurologic:  Normal speech and language. No gross focal neurologic deficits are appreciated.  Skin:  Skin is warm, dry and intact. No rash noted. Psychiatric: Mood and affect are normal. Speech and behavior are normal.  ____________________________________________   LABS (all labs ordered are listed, but only abnormal results are displayed)  PROCEDURES  Procedure(s) performed (including Critical  Care):  Procedures  ____________________________________________  FINAL CLINICAL IMPRESSION(S)   44 year old female is here with concerns of rectal bleeding.  She has pictures on her phone of bright red blood both in the toilet water and on toilet tissue.  On exam there is no external hemorrhoids and Hemoccult was questionably positive in 1 corner of the 2 areas that were observed.  Patient was given some Hemoccult slides to bring back to the office and also she is returning tomorrow for a CBC.  We have already discussed follow-up with an gastroenterologist.  She is reassured that this most likely is not an upper GI bleed and because of the stress she has been under and diarrhea most likely is just irritation of the colon.   ED Discharge Orders    None       Note:  This document was prepared using Dragon voice recognition software and may include unintentional dictation errors.

## 2019-07-28 NOTE — Progress Notes (Signed)
Pt states she's been having rectal bleeding since Thursday. Pt states there is no pain and pt does not see anything visible but can see blood dripping. Pt states as of in this moment she is not bleeding.

## 2019-07-29 ENCOUNTER — Other Ambulatory Visit: Payer: 59

## 2019-07-29 DIAGNOSIS — K625 Hemorrhage of anus and rectum: Secondary | ICD-10-CM

## 2019-07-29 NOTE — Progress Notes (Signed)
Alicia Duffy ordered CBC for yesterdays visit for rectal bleeding. CL,RMA

## 2019-07-30 LAB — CBC WITH DIFFERENTIAL/PLATELET
Basophils Absolute: 0.1 10*3/uL (ref 0.0–0.2)
Basos: 1 %
EOS (ABSOLUTE): 0.1 10*3/uL (ref 0.0–0.4)
Eos: 1 %
Hematocrit: 40.6 % (ref 34.0–46.6)
Hemoglobin: 13.9 g/dL (ref 11.1–15.9)
Immature Grans (Abs): 0 10*3/uL (ref 0.0–0.1)
Immature Granulocytes: 0 %
Lymphocytes Absolute: 1.9 10*3/uL (ref 0.7–3.1)
Lymphs: 42 %
MCH: 31.7 pg (ref 26.6–33.0)
MCHC: 34.2 g/dL (ref 31.5–35.7)
MCV: 93 fL (ref 79–97)
Monocytes Absolute: 0.5 10*3/uL (ref 0.1–0.9)
Monocytes: 10 %
Neutrophils Absolute: 2.1 10*3/uL (ref 1.4–7.0)
Neutrophils: 46 %
Platelets: 302 10*3/uL (ref 150–450)
RBC: 4.38 x10E6/uL (ref 3.77–5.28)
RDW: 11.7 % (ref 11.7–15.4)
WBC: 4.6 10*3/uL (ref 3.4–10.8)

## 2019-08-12 ENCOUNTER — Other Ambulatory Visit: Payer: Self-pay

## 2019-08-12 ENCOUNTER — Ambulatory Visit: Payer: Self-pay | Admitting: Emergency Medicine

## 2019-08-12 VITALS — BP 124/76 | HR 108 | Temp 98.0°F | Resp 14 | Ht 64.0 in | Wt 133.0 lb

## 2019-08-12 DIAGNOSIS — R1013 Epigastric pain: Secondary | ICD-10-CM

## 2019-08-12 MED ORDER — PANTOPRAZOLE SODIUM 40 MG PO TBEC: 40.0000 mg | DELAYED_RELEASE_TABLET | Freq: Two times a day (BID) | ORAL | 2 refills | Status: DC

## 2019-08-12 MED ORDER — CLONAZEPAM 0.5 MG PO TABS: 0.5000 mg | ORAL_TABLET | Freq: Two times a day (BID) | ORAL | 0 refills | Status: AC | PRN

## 2019-08-12 MED ORDER — SUCRALFATE 1 G PO TABS
1.0000 g | ORAL_TABLET | Freq: Three times a day (TID) | ORAL | 1 refills | Status: DC
Start: 2019-08-12 — End: 2020-07-21

## 2019-08-12 NOTE — Progress Notes (Signed)
Occupational Health Provider Note       Time seen: 4:18 PM    I have reviewed the vital signs and the nursing notes.  HISTORY   Chief Complaint Follow-up   HPI Alicia Duffy is a 44 y.o. female with a history of allergies, anemia, GERD, panic attacks who presents today for epigastric pain.  Patient reports abdominal pain for the past 4 weeks.  Recently she has seen some rectal bleeding but none currently.  Having a lot of epigastric pain today, feels like when she had H. pylori requiring triple therapy for eradication.  She states she has not eaten today because of the pain.  Past Medical History:  Diagnosis Date  . Allergy   . Anemia    d/t heavy menses  . Contraception    husband vasectomy  . Endometriosis    dx 2007 at time of surhery  . GERD (gastroesophageal reflux disease)   . Panic attacks   . Vaginal septum    [name], OB/GYN    Past Surgical History:  Procedure Laterality Date  . CESAREAN SECTION    . CHOLECYSTECTOMY    . OVARIAN CYST REMOVAL Left 2007    Allergies Demerol [meperidine] and Hydrocodone-acetaminophen   Review of Systems Constitutional: Negative for fever. Cardiovascular: Negative for chest pain. Respiratory: Negative for shortness of breath. Gastrointestinal: Positive for abdominal pain Musculoskeletal: Negative for back pain. Skin: Negative for rash. Neurological: Negative for headaches, focal weakness or numbness.  All systems negative/normal/unremarkable except as stated in the HPI  ____________________________________________   PHYSICAL EXAM:  VITAL SIGNS: Vitals:   08/12/19 1547  BP: 124/76  Pulse: (!) 108  Resp: 14  Temp: 98 F (36.7 C)  SpO2: 98%    Constitutional: Alert and oriented.  Mild distress from pain Eyes: Conjunctivae are normal. Normal extraocular movements. ENT      Head: Normocephalic and atraumatic.      Nose: No congestion/rhinnorhea.      Mouth/Throat: Mucous membranes are moist.       Neck: No stridor. Cardiovascular: Normal rate, regular rhythm. No murmurs, rubs, or gallops. Respiratory: Normal respiratory effort without tachypnea nor retractions. Breath sounds are clear and equal bilaterally. No wheezes/rales/rhonchi. Gastrointestinal: Epigastric tenderness, no rebound or guarding.  Normal bowel sounds. Musculoskeletal: Nontender with normal range of motion in extremities. No lower extremity tenderness nor edema. Neurologic:  Normal speech and language. No gross focal neurologic deficits are appreciated.  Skin:  Skin is warm, dry and intact. No rash noted. Psychiatric: Speech and behavior are normal.   ____________________________________________   LABS (pertinent positives/negatives)  Recent Results (from the past 2160 hour(s))  CBC with Differential/Platelet     Status: None   Collection Time: 07/29/19  8:16 AM  Result Value Ref Range   WBC 4.6 3.4 - 10.8 x10E3/uL   RBC 4.38 3.77 - 5.28 x10E6/uL   Hemoglobin 13.9 11.1 - 15.9 g/dL   Hematocrit 40.6 34.0 - 46.6 %   MCV 93 79 - 97 fL   MCH 31.7 26.6 - 33.0 pg   MCHC 34.2 31 - 35 g/dL   RDW 11.7 11.7 - 15.4 %   Platelets 302 150 - 450 x10E3/uL   Neutrophils 46 Not Estab. %   Lymphs 42 Not Estab. %   Monocytes 10 Not Estab. %   Eos 1 Not Estab. %   Basos 1 Not Estab. %   Neutrophils Absolute 2.1 1 - 7 x10E3/uL   Lymphocytes Absolute 1.9 0 - 3 x10E3/uL  Monocytes Absolute 0.5 0 - 0 x10E3/uL   EOS (ABSOLUTE) 0.1 0.0 - 0.4 x10E3/uL   Basophils Absolute 0.1 0 - 0 x10E3/uL   Immature Granulocytes 0 Not Estab. %   Immature Grans (Abs) 0.0 0.0 - 0.1 x10E3/uL     ASSESSMENT AND PLAN  Epigastric pain   Plan: The patient had presented for epigastric pain.  We will obtain a CBC, comprehensive metabolic panel, lipase and H. pylori labs with urgent GI referral.  I have increased her Protonix from once to twice a day, we have placed her on Carafate and I have refilled some Klonopin for her.  She has been going  through a stressful time.  She is cleared for close outpatient follow-up.  Lenise Arena MD    Note: This note was generated in part or whole with voice recognition software. Voice recognition is usually quite accurate but there are transcription errors that can and very often do occur. I apologize for any typographical errors that were not detected and corrected.

## 2019-08-12 NOTE — Progress Notes (Signed)
F/u abd x4 weeks. Pt husband 08/11/19 brought hemoccult cards in and per Dr.Williams results are negative Pt states she's in a lot of pain today and had a ruff night last night with pain in the epigastric region. CL,RMA

## 2019-08-15 ENCOUNTER — Other Ambulatory Visit: Payer: Self-pay

## 2019-08-15 DIAGNOSIS — R1013 Epigastric pain: Secondary | ICD-10-CM

## 2019-08-16 LAB — H PYLORI, IGM, IGG, IGA AB
H pylori, IgM Abs: <9 units (ref 0.0–8.9)
H. pylori, IgA Abs: <9 units (ref 0.0–8.9)
H. pylori, IgG AbS: 0.2 Index Value (ref 0.00–0.79)

## 2019-08-16 LAB — CBC WITH DIFFERENTIAL/PLATELET
Basophils Absolute: 0 10*3/uL (ref 0.0–0.2)
Basos: 0 %
EOS (ABSOLUTE): 0 10*3/uL (ref 0.0–0.4)
Eos: 0 %
Hematocrit: 43.1 % (ref 34.0–46.6)
Hemoglobin: 14.2 g/dL (ref 11.1–15.9)
Immature Grans (Abs): 0 10*3/uL (ref 0.0–0.1)
Immature Granulocytes: 0 %
Lymphocytes Absolute: 1.1 10*3/uL (ref 0.7–3.1)
Lymphs: 11 %
MCH: 31.3 pg (ref 26.6–33.0)
MCHC: 32.9 g/dL (ref 31.5–35.7)
MCV: 95 fL (ref 79–97)
Monocytes Absolute: 0.8 10*3/uL (ref 0.1–0.9)
Monocytes: 8 %
Neutrophils Absolute: 8.3 10*3/uL — ABNORMAL HIGH (ref 1.4–7.0)
Neutrophils: 81 %
Platelets: 346 10*3/uL (ref 150–450)
RBC: 4.54 x10E6/uL (ref 3.77–5.28)
RDW: 12 % (ref 11.7–15.4)
WBC: 10.4 10*3/uL (ref 3.4–10.8)

## 2019-08-16 LAB — COMPREHENSIVE METABOLIC PANEL
ALT: 10 IU/L (ref 0–32)
AST: 9 IU/L (ref 0–40)
Albumin/Globulin Ratio: 2 (ref 1.2–2.2)
Albumin: 4.7 g/dL (ref 3.8–4.8)
Alkaline Phosphatase: 62 IU/L (ref 48–121)
BUN/Creatinine Ratio: 17 (ref 9–23)
BUN: 9 mg/dL (ref 6–24)
Bilirubin Total: 0.4 mg/dL (ref 0.0–1.2)
CO2: 21 mmol/L (ref 20–29)
Calcium: 9.4 mg/dL (ref 8.7–10.2)
Chloride: 102 mmol/L (ref 96–106)
Creatinine, Ser: 0.52 mg/dL — ABNORMAL LOW (ref 0.57–1.00)
GFR calc Af Amer: 134 mL/min/{1.73_m2} (ref >59)
GFR calc non Af Amer: 117 mL/min/{1.73_m2} (ref >59)
Globulin, Total: 2.4 g/dL (ref 1.5–4.5)
Glucose: 90 mg/dL (ref 65–99)
Potassium: 3.9 mmol/L (ref 3.5–5.2)
Sodium: 139 mmol/L (ref 134–144)
Total Protein: 7.1 g/dL (ref 6.0–8.5)

## 2019-08-16 LAB — LIPASE: Lipase: 19 U/L (ref 14–72)

## 2019-08-18 ENCOUNTER — Encounter: Payer: Self-pay | Admitting: Gastroenterology

## 2019-09-25 DIAGNOSIS — Q5210 Doubling of vagina, unspecified: Secondary | ICD-10-CM | POA: Diagnosis not present

## 2019-09-25 DIAGNOSIS — R87610 Atypical squamous cells of undetermined significance on cytologic smear of cervix (ASC-US): Secondary | ICD-10-CM | POA: Diagnosis not present

## 2019-09-25 DIAGNOSIS — R69 Illness, unspecified: Secondary | ICD-10-CM | POA: Diagnosis not present

## 2019-09-25 DIAGNOSIS — R87619 Unspecified abnormal cytological findings in specimens from cervix uteri: Secondary | ICD-10-CM | POA: Diagnosis not present

## 2019-09-25 DIAGNOSIS — Z01419 Encounter for gynecological examination (general) (routine) without abnormal findings: Secondary | ICD-10-CM | POA: Diagnosis not present

## 2019-09-29 DIAGNOSIS — Q5210 Doubling of vagina, unspecified: Secondary | ICD-10-CM | POA: Insufficient documentation

## 2019-09-30 DIAGNOSIS — R87619 Unspecified abnormal cytological findings in specimens from cervix uteri: Secondary | ICD-10-CM | POA: Insufficient documentation

## 2019-09-30 HISTORY — DX: Unspecified abnormal cytological findings in specimens from cervix uteri: R87.619

## 2019-10-03 DIAGNOSIS — R87619 Unspecified abnormal cytological findings in specimens from cervix uteri: Secondary | ICD-10-CM | POA: Diagnosis not present

## 2019-10-03 DIAGNOSIS — Z3202 Encounter for pregnancy test, result negative: Secondary | ICD-10-CM | POA: Diagnosis not present

## 2019-10-03 DIAGNOSIS — R87618 Other abnormal cytological findings on specimens from cervix uteri: Secondary | ICD-10-CM | POA: Diagnosis not present

## 2019-10-14 ENCOUNTER — Encounter: Payer: Self-pay | Admitting: Gastroenterology

## 2019-10-14 ENCOUNTER — Ambulatory Visit (INDEPENDENT_AMBULATORY_CARE_PROVIDER_SITE_OTHER): Payer: 59 | Admitting: Gastroenterology

## 2019-10-14 VITALS — BP 90/60 | HR 105 | Ht 64.0 in | Wt 138.0 lb

## 2019-10-14 DIAGNOSIS — K625 Hemorrhage of anus and rectum: Secondary | ICD-10-CM | POA: Diagnosis not present

## 2019-10-14 DIAGNOSIS — K269 Duodenal ulcer, unspecified as acute or chronic, without hemorrhage or perforation: Secondary | ICD-10-CM

## 2019-10-14 DIAGNOSIS — R1013 Epigastric pain: Secondary | ICD-10-CM

## 2019-10-14 MED ORDER — SUPREP BOWEL PREP KIT 17.5-3.13-1.6 GM/177ML PO SOLN
1.0000 | ORAL | 0 refills | Status: DC
Start: 2019-10-14 — End: 2019-11-14

## 2019-10-14 MED ORDER — FAMOTIDINE 20 MG PO TABS: 20.0000 mg | ORAL_TABLET | Freq: Every day | ORAL | Status: DC

## 2019-10-14 NOTE — Progress Notes (Signed)
HPI: This is a very pleasant 45 year old woman who was referred to me by Earleen Newport, MD  to evaluate minor rectal bleeding, upper abdominal pain.    She tells me she has had duodenal ulcers in the past.  She underwent at least 2 or 3 upper endoscopies at [facility] around the year 2017.  She is not sure why she had those ulcers.  She was not taking a lot of NSAIDs around the time.  She has had severe gnawing midepigastric pain in June and July.  It lasted for weeks.  She lost some weight throughout this.  The pain would radiate to her back.  The pains have nearly completely resolved since she was given Carafate 4 times daily.  She has chronically been on a proton pump inhibitor.  Her medicine list below says she takes it twice a day but she does not.  She takes it once a day immediately after waking and does not eat her first meal the day for hours.  If she does not take proton pump inhibitors even just once a day she has significant GERD and pyrosis.  She does not have dysphagia  She has had 2 episodes of bright red blood per rectum dripping into the toilet, this was not even around the time of bowel movements.  This was when she was urinating.  Her weight is up 10 pounds in the past month.  Old Data Reviewed: Blood work August 2021 showed normal CBC, normal complete metabolic profile and H. pylori blood serologies were all negative.  She saw her primary care provider a little over 2 months ago for minor rectal bleeding.   Review of systems: Pertinent positive and negative review of systems were noted in the above HPI section. All other review negative.   Past Medical History:  Diagnosis Date  . Allergy   . Anemia    d/t heavy menses  . Contraception    husband vasectomy  . Endometriosis    dx 2007 at time of surhery  . GERD (gastroesophageal reflux disease)   . Panic attacks   . Vaginal septum    [name], OB/GYN    Past Surgical History:  Procedure  Laterality Date  . CESAREAN SECTION    . CHOLECYSTECTOMY    . OVARIAN CYST REMOVAL Left 2007    Current Outpatient Medications  Medication Sig Dispense Refill  . clonazePAM (KLONOPIN) 0.5 MG tablet Take 1 tablet (0.5 mg total) by mouth 2 (two) times daily as needed for anxiety. 30 tablet 0  . Cranberry-Vitamin C 15000-100 MG CAPS Take 1 Dose by mouth daily.    . ferrous sulfate 324 MG TBEC Take 324 mg by mouth daily.    Keplinger Kitchen FERROUS SULFATE PO Take 500 mg by mouth 2 (two) times daily with a meal.    . Lactobacillus-Inulin (CULTURELLE DIGESTIVE HEALTH PO) Take 1 Dose by mouth daily.    . pantoprazole (PROTONIX) 40 MG tablet Take 1 tablet (40 mg total) by mouth 2 (two) times daily before a meal. 60 tablet 2  . sucralfate (CARAFATE) 1 g tablet Take 1 tablet (1 g total) by mouth 4 (four) times daily -  with meals and at bedtime. 90 tablet 1   No current facility-administered medications for this visit.    Allergies as of 10/14/2019 - Review Complete 10/14/2019  Allergen Reaction Noted  . Demerol [meperidine] Itching 01/16/2012  . Hydrocodone-acetaminophen Nausea And Vomiting 03/30/2014    Family History  Problem Relation Age  of Onset  . Cancer Mother   . Heart disease Father   . Depression Father   . Heart attack Father   . Heart attack Brother   . Heart attack Paternal Grandfather   . Heart attack Brother   . Breast cancer Neg Hx     Social History   Socioeconomic History  . Marital status: Married    Spouse name: Not on file  . Number of children: 2  . Years of education: Not on file  . Highest education level: Not on file  Occupational History  . Occupation: works part time   Tobacco Use  . Smoking status: Never Smoker  . Smokeless tobacco: Never Used  Substance and Sexual Activity  . Alcohol use: No  . Drug use: No  . Sexual activity: Yes  Other Topics Concern  . Not on file  Social History Narrative  . Not on file   Social Determinants of Health   Financial  Resource Strain:   . Difficulty of Paying Living Expenses: Not on file  Food Insecurity:   . Worried About Charity fundraiser in the Last Year: Not on file  . Ran Out of Food in the Last Year: Not on file  Transportation Needs:   . Lack of Transportation (Medical): Not on file  . Lack of Transportation (Non-Medical): Not on file  Physical Activity:   . Days of Exercise per Week: Not on file  . Minutes of Exercise per Session: Not on file  Stress:   . Feeling of Stress : Not on file  Social Connections:   . Frequency of Communication with Friends and Family: Not on file  . Frequency of Social Gatherings with Friends and Family: Not on file  . Attends Religious Services: Not on file  . Active Member of Clubs or Organizations: Not on file  . Attends Archivist Meetings: Not on file  . Marital Status: Not on file  Intimate Partner Violence:   . Fear of Current or Ex-Partner: Not on file  . Emotionally Abused: Not on file  . Physically Abused: Not on file  . Sexually Abused: Not on file     Physical Exam: BP 90/60   Pulse (!) 105   Ht 5\' 4"  (1.626 m)   Wt 138 lb (62.6 kg)   BMI 23.69 kg/m  Constitutional: generally well-appearing Psychiatric: alert and oriented x3 Eyes: extraocular movements intact Mouth: oral pharynx moist, no lesions Neck: supple no lymphadenopathy Cardiovascular: heart regular rate and rhythm Lungs: clear to auscultation bilaterally Abdomen: soft, mild upper epigastric discomfort, nondistended, no obvious ascites, no peritoneal signs, normal bowel sounds Extremities: no lower extremity edema bilaterally Skin: no lesions on visible extremities   Assessment and plan: 44 y.o. female with history of duodenal ulcers, recent gnawing epigastric pain, minor rectal bleeding, history of GERD  First we will try to get records from her previous gastroenterologist here for review.  Second she needs a colonoscopy for her minor rectal bleeding.  Third at  the same time I would like to proceed with upper endoscopy to see if she has duodenal ulcers again as she tells me she had in the past.  She is not taking proton pump inhibitors the correct time in relation to eating meals and so I educated her about that.  She will take pantoprazole 40 mg once daily shortly for her lunch meal now.  She will also start taking Pepcid at bedtime every night.  I think this will  be better overall acid control when she was on the floor.   Please see the "Patient Instructions" section for addition details about the plan.   Owens Loffler, MD [facility] 10/14/2019, 10:54 AM  Cc: Earleen Newport, MD  Total time on date of encounter was 45 minutes (this included time spent preparing to see the patient reviewing records; obtaining and/or reviewing separately obtained history; performing a medically appropriate exam and/or evaluation; counseling and educating the patient and family if present; ordering medications, tests or procedures if applicable; and documenting clinical information in the health record).

## 2019-10-14 NOTE — Patient Instructions (Addendum)
If you are age 44 or older, your body mass index should be between 23-30. Your Body mass index is 23.69 kg/m. If this is out of the aforementioned range listed, please consider follow up with your Primary Care Provider.  If you are age 54 or younger, your body mass index should be between 19-25. Your Body mass index is 23.69 kg/m. If this is out of the aformentioned range listed, please consider follow up with your Primary Care Provider.   You have been scheduled for an endoscopy and colonoscopy. Please follow the written instructions given to you at your visit today. Please pick up your prep supplies at the pharmacy within the next 1-3 days. If you use inhalers (even only as needed), please bring them with you on the day of your procedure.  Due to recent changes in healthcare laws, you may see the results of your imaging and laboratory studies on MyChart before your provider has had a chance to review them.  We understand that in some cases there may be results that are confusing or concerning to you. Not all laboratory results come back in the same time frame and the provider may be waiting for multiple results in order to interpret others.  Please give [place] 48 hours in order for your provider to thoroughly review all the results before contacting the office for clarification of your results.   Please take pantoprazole 20 to 30 minutes prior to your first meal of the day.  Please purchase the following medications over the counter and take as directed:  START: pepcid 20mg  take one tablet every night at bedtime.  We will attempt to obtain records from previous GI office ([facility]).  Thank you for entrusting me with your care and choosing [facility].  Dr Ardis Hughs

## 2019-10-23 DIAGNOSIS — R8769 Abnormal cytological findings in specimens from other female genital organs: Secondary | ICD-10-CM | POA: Diagnosis not present

## 2019-10-23 DIAGNOSIS — R87610 Atypical squamous cells of undetermined significance on cytologic smear of cervix (ASC-US): Secondary | ICD-10-CM | POA: Diagnosis not present

## 2019-11-12 ENCOUNTER — Encounter: Payer: Self-pay | Admitting: Gastroenterology

## 2019-11-14 ENCOUNTER — Other Ambulatory Visit: Payer: Self-pay

## 2019-11-14 ENCOUNTER — Telehealth: Payer: Self-pay | Admitting: *Deleted

## 2019-11-14 ENCOUNTER — Encounter: Payer: Self-pay | Admitting: Gastroenterology

## 2019-11-14 ENCOUNTER — Ambulatory Visit (AMBULATORY_SURGERY_CENTER): Payer: 59 | Admitting: Gastroenterology

## 2019-11-14 VITALS — BP 100/66 | HR 77 | Temp 97.5°F | Resp 14 | Ht 64.0 in | Wt 138.0 lb

## 2019-11-14 DIAGNOSIS — K449 Diaphragmatic hernia without obstruction or gangrene: Secondary | ICD-10-CM

## 2019-11-14 DIAGNOSIS — K269 Duodenal ulcer, unspecified as acute or chronic, without hemorrhage or perforation: Secondary | ICD-10-CM

## 2019-11-14 DIAGNOSIS — K625 Hemorrhage of anus and rectum: Secondary | ICD-10-CM

## 2019-11-14 DIAGNOSIS — K5730 Diverticulosis of large intestine without perforation or abscess without bleeding: Secondary | ICD-10-CM | POA: Diagnosis not present

## 2019-11-14 DIAGNOSIS — R1013 Epigastric pain: Secondary | ICD-10-CM

## 2019-11-14 DIAGNOSIS — Z1211 Encounter for screening for malignant neoplasm of colon: Secondary | ICD-10-CM | POA: Diagnosis not present

## 2019-11-14 MED ORDER — SODIUM CHLORIDE 0.9 % IV SOLN: 500.0000 mL | Freq: Once | INTRAVENOUS | Status: DC

## 2019-11-14 NOTE — Telephone Encounter (Signed)
Late entry- I spoke with Delsa Bern at client services on 11-13-19 at 1500- results are negative.

## 2019-11-14 NOTE — Patient Instructions (Signed)
Please read handouts provided. Continue present medications. Repeat colonoscopy in 10 years for screening.     YOU HAD AN ENDOSCOPIC PROCEDURE TODAY AT THE [facility]:   Refer to the procedure report that was given to you for any specific questions about what was found during the examination.  If the procedure report does not answer your questions, please call your gastroenterologist to clarify.  If you requested that your care partner not be given the details of your procedure findings, then the procedure report has been included in a sealed envelope for you to review at your convenience later.  YOU SHOULD EXPECT: Some feelings of bloating in the abdomen. Passage of more gas than usual.  Walking can help get rid of the air that was put into your GI tract during the procedure and reduce the bloating. If you had a lower endoscopy (such as a colonoscopy or flexible sigmoidoscopy) you may notice spotting of blood in your stool or on the toilet paper. If you underwent a bowel prep for your procedure, you may not have a normal bowel movement for a few days.  Please Note:  You might notice some irritation and congestion in your nose or some drainage.  This is from the oxygen used during your procedure.  There is no need for concern and it should clear up in a day or so.  SYMPTOMS TO REPORT IMMEDIATELY:  Following lower endoscopy (colonoscopy or flexible sigmoidoscopy):  Excessive amounts of blood in the stool  Significant tenderness or worsening of abdominal pains  Swelling of the abdomen that is new, acute  Fever of 100F or higher  Following upper endoscopy (EGD)  Vomiting of blood or coffee ground material  New chest pain or pain under the shoulder blades  Painful or persistently difficult swallowing  New shortness of breath  Fever of 100F or higher  Black, tarry-looking stools  For urgent or emergent issues, a gastroenterologist can be reached at any hour by calling [phone]  [phone]. Do not use MyChart messaging for urgent concerns.    DIET:  We do recommend a small meal at first, but then you may proceed to your regular diet.  Drink plenty of fluids but you should avoid alcoholic beverages for 24 hours.  ACTIVITY:  You should plan to take it easy for the rest of today and you should NOT DRIVE or use heavy machinery until tomorrow (because of the sedation medicines used during the test).    FOLLOW UP: Our staff will call the number listed on your records 48-72 hours following your procedure to check on you and address any questions or concerns that you may have regarding the information given to you following your procedure. If we do not reach you, we will leave a message.  We will attempt to reach you two times.  During this call, we will ask if you have developed any symptoms of COVID 19. If you develop any symptoms (ie: fever, flu-like symptoms, shortness of breath, cough etc.) before then, please call [phone].  If you test positive for Covid 19 in the 2 weeks post procedure, please call and report this information to us.    If any biopsies were taken you will be contacted by phone or by letter within the next 1-3 weeks.  Please call us at [phone] if you have not heard about the biopsies in 3 weeks.    SIGNATURES/CONFIDENTIALITY: You and/or your care partner have signed paperwork which will be entered into your   into your electronic medical record.  These signatures attest to the fact that that the information above on your After Visit Summary has been reviewed and is understood.  Full responsibility of the confidentiality of this discharge information lies with you and/or your care-partner.

## 2019-11-14 NOTE — Progress Notes (Signed)
VS taken by C.W. 

## 2019-11-14 NOTE — Op Note (Signed)
[place] Patient Name: Alicia Duffy Procedure Date: 11/14/2019 2:46 PM MRN: [id number] Endoscopist: Milus Banister , MD Age: 44 Referring MD:  Date of Birth: [birth year] Gender: Female Account #: [id number] Procedure:                Colonoscopy Indications:              Rectal bleeding Medicines:                Monitored Anesthesia Care Procedure:                Pre-Anesthesia Assessment:                           - Prior to the procedure, a History and Physical                            was performed, and patient medications and                            allergies were reviewed. The patient's tolerance of                            previous anesthesia was also reviewed. The risks                            and benefits of the procedure and the sedation                            options and risks were discussed with the patient.                            All questions were answered, and informed consent                            was obtained. Prior Anticoagulants: The patient has                            taken no previous anticoagulant or antiplatelet                            agents. ASA Grade Assessment: II - A patient with                            mild systemic disease. After reviewing the risks                            and benefits, the patient was deemed in                            satisfactory condition to undergo the procedure.                           After obtaining informed consent, the colonoscope  was passed under direct vision. Throughout the                            procedure, the patient's blood pressure, pulse, and                            oxygen saturations were monitored continuously. The                            Colonoscope was introduced through the anus and                            advanced to the the cecum, identified by                            appendiceal orifice and ileocecal valve. The                             colonoscopy was performed without difficulty. The                            patient tolerated the procedure well. The quality                            of the bowel preparation was good. The ileocecal                            valve, appendiceal orifice, and rectum were                            photographed. Scope In: 2:53:18 PM Scope Out: 3:08:50 PM Scope Withdrawal Time: 0 hours 10 minutes 58 seconds  Total Procedure Duration: 0 hours 15 minutes 32 seconds  Findings:                 A few small-mouthed diverticula were found in the                            left colon.                           The exam was otherwise without abnormality on                            direct and retroflexion views. Complications:            No immediate complications. Estimated blood loss:                            None. Estimated Blood Loss:     Estimated blood loss: none. Impression:               - Diverticulosis in the left colon.                           - The examination was otherwise normal on direct  and retroflexion views.                           - No polyps or cancers. Recommendation:           - Patient has a contact number available for                            emergencies. The signs and symptoms of potential                            delayed complications were discussed with the                            patient. Return to normal activities tomorrow.                            Written discharge instructions were provided to the                            patient.                           - Resume previous diet.                           - Continue present medications.                           - Repeat colonoscopy in 10 years for screening. Milus Banister, MD 11/14/2019 3:14:37 PM This report has been signed electronically.

## 2019-11-14 NOTE — Op Note (Signed)
[place] Patient Name: Alicia Duffy Procedure Date: 11/14/2019 2:45 PM MRN: [id number] Endoscopist: Milus Banister , MD Age: 44 Referring MD:  Date of Birth: Jul 16, 1975 Gender: Female Account #: [id number] Procedure:                Upper GI endoscopy Indications:              Epigastric abdominal pain, h/o duodenal ulcers                            (outside EGDs) Medicines:                Monitored Anesthesia Care Procedure:                Pre-Anesthesia Assessment:                           - Prior to the procedure, a History and Physical                            was performed, and patient medications and                            allergies were reviewed. The patient's tolerance of                            previous anesthesia was also reviewed. The risks                            and benefits of the procedure and the sedation                            options and risks were discussed with the patient.                            All questions were answered, and informed consent                            was obtained. Prior Anticoagulants: The patient has                            taken no previous anticoagulant or antiplatelet                            agents. ASA Grade Assessment: II - A patient with                            mild systemic disease. After reviewing the risks                            and benefits, the patient was deemed in                            satisfactory condition to undergo the procedure.  After obtaining informed consent, the endoscope was                            passed under direct vision. Throughout the                            procedure, the patient's blood pressure, pulse, and                            oxygen saturations were monitored continuously. The                            Endoscope was introduced through the mouth, and                            advanced to the second part of duodenum.  The upper                            GI endoscopy was accomplished without difficulty.                            The patient tolerated the procedure well. Scope In: Scope Out: Findings:                 A small hiatal hernia was present.                           The exam was otherwise without abnormality. Complications:            No immediate complications. Estimated blood loss:                            None. Estimated Blood Loss:     Estimated blood loss: none. Impression:               - Small hiatal hernia.                           - The examination was otherwise normal.                           - No specimens collected. Recommendation:           - Patient has a contact number available for                            emergencies. The signs and symptoms of potential                            delayed complications were discussed with the                            patient. Return to normal activities tomorrow.                            Written discharge instructions were provided to the  patient.                           - Resume previous diet.                           - Continue present medications. Protonix shortly                            before your lunch meal and pepcid at bedtime. Milus Banister, MD 11/14/2019 3:17:15 PM This report has been signed electronically.

## 2019-11-14 NOTE — Progress Notes (Signed)
To PACU, VSS. Report to [email or web address] 

## 2019-11-18 ENCOUNTER — Telehealth: Payer: Self-pay | Admitting: Gastroenterology

## 2019-11-18 ENCOUNTER — Telehealth: Payer: Self-pay

## 2019-11-18 NOTE — Telephone Encounter (Signed)
I reviewed a packet of information from [facility] gastroenterology.  EGD March 11, 2012.  Indication "abdominal pain/chronic, diffuse upper without gallstones, low normal ejection fraction of gallbladder, and unremarkable CT" findings essentially normal examination.  Biopsies were taken from the stomach.  Pathology was negative for H. pylori.

## 2019-11-18 NOTE — Telephone Encounter (Signed)
  Follow up Call-  Call back number 11/14/2019  Post procedure Call Back phone  # [phone]  Permission to leave phone message Yes  Some recent data might be hidden     Patient questions:  Do you have a fever, pain , or abdominal swelling? No. Pain Score  0 *  Have you tolerated food without any problems? Yes.    Have you been able to return to your normal activities? Yes.    Do you have any questions about your discharge instructions: Diet   No. Medications  No. Follow up visit  No.  Do you have questions or concerns about your Care? No.  Actions: * If pain score is 4 or above: 1. No action needed, pain <4.Have you developed a fever since your procedure? no  2.   Have you had an respiratory symptoms (SOB or cough) since your procedure? no 3.   Have you tested positive for COVID 19 since your procedure no  4.   Have you had any family members/close contacts diagnosed with the COVID 19 since your procedure?  no   If yes to any of these questions please route to Joylene John, RN and Joella Prince, RN

## 2020-04-23 ENCOUNTER — Other Ambulatory Visit: Payer: Self-pay

## 2020-04-23 DIAGNOSIS — K219 Gastro-esophageal reflux disease without esophagitis: Secondary | ICD-10-CM

## 2020-04-23 MED ORDER — PANTOPRAZOLE SODIUM 40 MG PO TBEC: 40.0000 mg | DELAYED_RELEASE_TABLET | Freq: Every day | ORAL | 1 refills | Status: DC

## 2020-06-17 ENCOUNTER — Other Ambulatory Visit: Payer: Self-pay | Admitting: Physician Assistant

## 2020-06-17 DIAGNOSIS — Z1231 Encounter for screening mammogram for malignant neoplasm of breast: Secondary | ICD-10-CM

## 2020-06-30 ENCOUNTER — Other Ambulatory Visit: Payer: Self-pay

## 2020-06-30 ENCOUNTER — Ambulatory Visit
Admission: RE | Admit: 2020-06-30 | Discharge: 2020-06-30 | Disposition: A | Discharge status: Home / Self Care | Payer: 59 | Source: Ambulatory Visit | Attending: Physician Assistant | Admitting: Physician Assistant

## 2020-06-30 DIAGNOSIS — Z1231 Encounter for screening mammogram for malignant neoplasm of breast: Secondary | ICD-10-CM | POA: Diagnosis not present

## 2020-07-12 NOTE — Progress Notes (Signed)
Pt scheduled to complete annual physical 07/21/20  Reviewed CDC recommendations for importance of HIV/ Hep C screening once in lifetime. Patient has declined HIV / Hep C screenings today and will let us know if they should change their mind in the future.

## 2020-07-14 ENCOUNTER — Other Ambulatory Visit: Payer: Self-pay

## 2020-07-14 ENCOUNTER — Ambulatory Visit: Payer: Self-pay

## 2020-07-14 DIAGNOSIS — R5383 Other fatigue: Secondary | ICD-10-CM

## 2020-07-14 DIAGNOSIS — Z0000 Encounter for general adult medical examination without abnormal findings: Secondary | ICD-10-CM

## 2020-07-14 LAB — POCT URINALYSIS DIPSTICK
Bilirubin, UA: NEGATIVE
Blood, UA: NEGATIVE
Glucose, UA: NEGATIVE
Ketones, UA: POSITIVE
Leukocytes, UA: NEGATIVE
Nitrite, UA: NEGATIVE
Protein, UA: POSITIVE — AB
Spec Grav, UA: 1.015 (ref 1.010–1.025)
Urobilinogen, UA: 0.2 E.U./dL
pH, UA: 7 (ref 5.0–8.0)

## 2020-07-15 LAB — CMP12+LP+TP+TSH+6AC+CBC/D/PLT
ALT: 13 IU/L (ref 0–32)
AST: 16 IU/L (ref 0–40)
Albumin/Globulin Ratio: 2 (ref 1.2–2.2)
Albumin: 4.7 g/dL (ref 3.8–4.8)
Alkaline Phosphatase: 57 IU/L (ref 44–121)
BUN/Creatinine Ratio: 21 (ref 9–23)
BUN: 12 mg/dL (ref 6–24)
Bilirubin Total: 0.4 mg/dL (ref 0.0–1.2)
Calcium: 9.2 mg/dL (ref 8.7–10.2)
Chloride: 103 mmol/L (ref 96–106)
Chol/HDL Ratio: 3.2 ratio (ref 0.0–4.4)
Cholesterol, Total: 231 mg/dL — ABNORMAL HIGH (ref 100–199)
Creatinine, Ser: 0.57 mg/dL (ref 0.57–1.00)
Estimated CHD Risk: <0.5 times avg. (ref 0.0–1.0)
Free Thyroxine Index: 1.8 (ref 1.2–4.9)
GGT: 12 IU/L (ref 0–60)
Globulin, Total: 2.3 g/dL (ref 1.5–4.5)
Glucose: 88 mg/dL (ref 65–99)
HDL: 73 mg/dL (ref >39)
Iron: 123 ug/dL (ref 27–159)
LDH: 153 IU/L (ref 119–226)
LDL Chol Calc (NIH): 151 mg/dL — ABNORMAL HIGH (ref 0–99)
Phosphorus: 3.3 mg/dL (ref 3.0–4.3)
Potassium: 4.1 mmol/L (ref 3.5–5.2)
Sodium: 140 mmol/L (ref 134–144)
T3 Uptake Ratio: 24 % (ref 24–39)
T4, Total: 7.5 ug/dL (ref 4.5–12.0)
TSH: 1.77 u[IU]/mL (ref 0.450–4.500)
Total Protein: 7 g/dL (ref 6.0–8.5)
Triglycerides: 44 mg/dL (ref 0–149)
Uric Acid: 2.9 mg/dL (ref 2.6–6.2)
VLDL Cholesterol Cal: 7 mg/dL (ref 5–40)
eGFR: 114 mL/min/{1.73_m2} (ref >59)

## 2020-07-15 LAB — IRON,TIBC AND FERRITIN PANEL
Ferritin: 29 ng/mL (ref 15–150)
Iron Saturation: 37 % (ref 15–55)
Total Iron Binding Capacity: 332 ug/dL (ref 250–450)
UIBC: 209 ug/dL (ref 131–425)

## 2020-07-15 NOTE — Addendum Note (Signed)
Addended by: Malachy Moan F on: 07/15/2020 02:57 PM   Modules accepted: Orders

## 2020-07-16 LAB — CBC WITH DIFFERENTIAL/PLATELET
Basophils Absolute: 0 10*3/uL (ref 0.0–0.2)
Basos: 1 %
EOS (ABSOLUTE): 0 10*3/uL (ref 0.0–0.4)
Eos: 1 %
Hematocrit: 39.6 % (ref 34.0–46.6)
Hemoglobin: 12.9 g/dL (ref 11.1–15.9)
Immature Grans (Abs): 0 10*3/uL (ref 0.0–0.1)
Immature Granulocytes: 0 %
Lymphocytes Absolute: 1.4 10*3/uL (ref 0.7–3.1)
Lymphs: 31 %
MCH: 31.5 pg (ref 26.6–33.0)
MCHC: 32.6 g/dL (ref 31.5–35.7)
MCV: 97 fL (ref 79–97)
Monocytes Absolute: 0.4 10*3/uL (ref 0.1–0.9)
Monocytes: 8 %
Neutrophils Absolute: 2.8 10*3/uL (ref 1.4–7.0)
Neutrophils: 59 %
Platelets: 321 10*3/uL (ref 150–450)
RBC: 4.1 x10E6/uL (ref 3.77–5.28)
RDW: 12.6 % (ref 11.7–15.4)
WBC: 4.7 10*3/uL (ref 3.4–10.8)

## 2020-07-21 ENCOUNTER — Ambulatory Visit: Payer: Self-pay | Admitting: Physician Assistant

## 2020-07-21 ENCOUNTER — Other Ambulatory Visit: Payer: Self-pay

## 2020-07-21 ENCOUNTER — Encounter: Payer: Self-pay | Admitting: Physician Assistant

## 2020-07-21 VITALS — BP 110/72 | HR 83 | Temp 98.4°F | Resp 14 | Ht 63.0 in | Wt 133.0 lb

## 2020-07-21 DIAGNOSIS — R5383 Other fatigue: Secondary | ICD-10-CM

## 2020-07-21 DIAGNOSIS — Z0000 Encounter for general adult medical examination without abnormal findings: Secondary | ICD-10-CM

## 2020-07-21 NOTE — Progress Notes (Signed)
Pt concerned of iron levels due to extreme fatigue and when she takes her iron pills it makes her feel worse.CL,RMA

## 2020-07-21 NOTE — Progress Notes (Signed)
   Subjective: Annual physical exam    Patient ID: Alicia Duffy, female    DOB: 1975/05/06, 45 y.o.   MRN: [id number]  HPI Patient is presents for annual physical exam.  Patient voiced concern for chronic fatigue.   Review of Systems Anemia, GERD, and hyperlipidemia.  The    Objective:   Physical Exam No acute distress.  Temperature 98.4, pulse 83, respiration 14, BP is 110/72, patient 96% O2 sat on room air.  Patient was on 33 pounds and BMI is 23.6.   HEENT is unremarkable.  Neck is supple for adenopathy or bruits.  Lungs are clear to auscultation.  No acute findings on EKG. Abdomen with negative HSM, normoactive bowel sounds, soft, nontender to palpation. No obvious deformity to the upper or lower extremities.  Patient has full and equal range of motion of the upper and lower extremities. No obvious cervical or lumbar spine deformity.  Patient has full and equal range of motion of the cervical and lumbar spine. Cranial nerves II through XII grossly intact.  DTR 2+ without clonus.        Assessment & Plan: Well exam.   Discussed lab results with patient.  Patient cholesterol is 231.  Rest of the lipid profile within normal range.  Patient is adamant she will not take any statins secondary to patient preference.  Discussed supplements to consistent officials.  Advised patient to get a B12 and vitamin D levels to follow-up on her chronic fatigue.

## 2020-07-22 LAB — VITAMIN B12: Vitamin B-12: 191 pg/mL — ABNORMAL LOW (ref 232–1245)

## 2020-07-23 LAB — SPECIMEN STATUS REPORT

## 2020-07-23 LAB — VITAMIN D 25 HYDROXY (VIT D DEFICIENCY, FRACTURES): Vit D, 25-Hydroxy: 31.2 ng/mL (ref 30.0–100.0)

## 2020-07-26 ENCOUNTER — Other Ambulatory Visit: Payer: Self-pay | Admitting: Physician Assistant

## 2020-07-26 MED ORDER — VITAMIN D (ERGOCALCIFEROL) 1.25 MG (50000 UNIT) PO CAPS: 50000.0000 [IU] | ORAL_CAPSULE | ORAL | 2 refills | Status: DC

## 2020-07-26 MED ORDER — VITAMIN B-12 1000 MCG PO TABS: 1000.0000 ug | ORAL_TABLET | Freq: Every day | ORAL | 3 refills | Status: DC

## 2020-10-20 DIAGNOSIS — H10413 Chronic giant papillary conjunctivitis, bilateral: Secondary | ICD-10-CM | POA: Diagnosis not present

## 2020-10-20 DIAGNOSIS — H5213 Myopia, bilateral: Secondary | ICD-10-CM | POA: Diagnosis not present

## 2021-02-18 ENCOUNTER — Other Ambulatory Visit: Payer: Self-pay

## 2021-02-18 DIAGNOSIS — K219 Gastro-esophageal reflux disease without esophagitis: Secondary | ICD-10-CM

## 2021-02-18 MED ORDER — PANTOPRAZOLE SODIUM 40 MG PO TBEC: 40.0000 mg | DELAYED_RELEASE_TABLET | Freq: Every day | ORAL | 3 refills | Status: DC

## 2021-04-15 DIAGNOSIS — D2239 Melanocytic nevi of other parts of face: Secondary | ICD-10-CM | POA: Diagnosis not present

## 2021-05-27 ENCOUNTER — Other Ambulatory Visit: Payer: Self-pay | Admitting: Physician Assistant

## 2021-06-02 ENCOUNTER — Other Ambulatory Visit: Payer: Self-pay | Admitting: Physician Assistant

## 2021-06-02 DIAGNOSIS — Z1231 Encounter for screening mammogram for malignant neoplasm of breast: Secondary | ICD-10-CM

## 2021-07-13 ENCOUNTER — Ambulatory Visit
Admission: RE | Admit: 2021-07-13 | Discharge: 2021-07-13 | Disposition: A | Discharge status: Home / Self Care | Payer: 59 | Source: Ambulatory Visit | Attending: Physician Assistant | Admitting: Physician Assistant

## 2021-07-13 ENCOUNTER — Ambulatory Visit: Payer: Self-pay

## 2021-07-13 DIAGNOSIS — Z0000 Encounter for general adult medical examination without abnormal findings: Secondary | ICD-10-CM

## 2021-07-13 DIAGNOSIS — Z1231 Encounter for screening mammogram for malignant neoplasm of breast: Secondary | ICD-10-CM | POA: Insufficient documentation

## 2021-07-13 LAB — POCT URINALYSIS DIPSTICK
Bilirubin, UA: NEGATIVE
Blood, UA: POSITIVE
Glucose, UA: NEGATIVE
Ketones, UA: NEGATIVE
Leukocytes, UA: NEGATIVE
Nitrite, UA: NEGATIVE
Protein, UA: POSITIVE — AB
Spec Grav, UA: 1.015 (ref 1.010–1.025)
Urobilinogen, UA: 0.2 E.U./dL
pH, UA: 8 (ref 5.0–8.0)

## 2021-07-13 NOTE — Progress Notes (Signed)
Pt presents today for physical labs, will return to clinic for scheduled physical.

## 2021-07-14 LAB — CMP12+LP+TP+TSH+6AC+CBC/D/PLT
ALT: 10 IU/L (ref 0–32)
AST: 16 IU/L (ref 0–40)
Albumin/Globulin Ratio: 1.7 (ref 1.2–2.2)
Albumin: 4.6 g/dL (ref 3.9–4.9)
Alkaline Phosphatase: 56 IU/L (ref 44–121)
BUN/Creatinine Ratio: 17 (ref 9–23)
BUN: 12 mg/dL (ref 6–24)
Basophils Absolute: 0.1 10*3/uL (ref 0.0–0.2)
Basos: 1 %
Bilirubin Total: 0.3 mg/dL (ref 0.0–1.2)
Calcium: 9.4 mg/dL (ref 8.7–10.2)
Chloride: 103 mmol/L (ref 96–106)
Chol/HDL Ratio: 3.4 ratio (ref 0.0–4.4)
Cholesterol, Total: 262 mg/dL — ABNORMAL HIGH (ref 100–199)
Creatinine, Ser: 0.7 mg/dL (ref 0.57–1.00)
EOS (ABSOLUTE): 0 10*3/uL (ref 0.0–0.4)
Eos: 1 %
Estimated CHD Risk: <0.5 times avg. (ref 0.0–1.0)
Free Thyroxine Index: 1.6 (ref 1.2–4.9)
GGT: 10 IU/L (ref 0–60)
Globulin, Total: 2.7 g/dL (ref 1.5–4.5)
Glucose: 90 mg/dL (ref 70–99)
HDL: 78 mg/dL (ref >39)
Hematocrit: 38.1 % (ref 34.0–46.6)
Hemoglobin: 12.9 g/dL (ref 11.1–15.9)
Immature Grans (Abs): 0 10*3/uL (ref 0.0–0.1)
Immature Granulocytes: 0 %
Iron: 81 ug/dL (ref 27–159)
LDH: 162 IU/L (ref 119–226)
LDL Chol Calc (NIH): 176 mg/dL — ABNORMAL HIGH (ref 0–99)
Lymphocytes Absolute: 1.7 10*3/uL (ref 0.7–3.1)
Lymphs: 33 %
MCH: 31.2 pg (ref 26.6–33.0)
MCHC: 33.9 g/dL (ref 31.5–35.7)
MCV: 92 fL (ref 79–97)
Monocytes Absolute: 0.4 10*3/uL (ref 0.1–0.9)
Monocytes: 8 %
Neutrophils Absolute: 3 10*3/uL (ref 1.4–7.0)
Neutrophils: 57 %
Phosphorus: 3.8 mg/dL (ref 3.0–4.3)
Platelets: 320 10*3/uL (ref 150–450)
Potassium: 4.1 mmol/L (ref 3.5–5.2)
RBC: 4.14 x10E6/uL (ref 3.77–5.28)
RDW: 12.9 % (ref 11.7–15.4)
Sodium: 140 mmol/L (ref 134–144)
T3 Uptake Ratio: 21 % — ABNORMAL LOW (ref 24–39)
T4, Total: 7.4 ug/dL (ref 4.5–12.0)
TSH: 2.1 u[IU]/mL (ref 0.450–4.500)
Total Protein: 7.3 g/dL (ref 6.0–8.5)
Triglycerides: 52 mg/dL (ref 0–149)
Uric Acid: 3 mg/dL (ref 2.6–6.2)
VLDL Cholesterol Cal: 8 mg/dL (ref 5–40)
WBC: 5.2 10*3/uL (ref 3.4–10.8)
eGFR: 108 mL/min/{1.73_m2} (ref >59)

## 2021-07-20 ENCOUNTER — Ambulatory Visit: Payer: 59 | Admitting: Physician Assistant

## 2021-07-20 ENCOUNTER — Encounter: Payer: Self-pay | Admitting: Physician Assistant

## 2021-07-20 VITALS — BP 110/67 | HR 71 | Temp 97.6°F | Resp 14 | Ht 64.0 in | Wt 136.0 lb

## 2021-07-20 DIAGNOSIS — Z0000 Encounter for general adult medical examination without abnormal findings: Secondary | ICD-10-CM

## 2021-07-20 DIAGNOSIS — E7800 Pure hypercholesterolemia, unspecified: Secondary | ICD-10-CM

## 2021-07-20 NOTE — Progress Notes (Signed)
Pt presents today to complete physical. Pt denies any issues

## 2021-07-20 NOTE — Progress Notes (Signed)
[facility] ____________________________________________   None    (approximate)  I have reviewed the triage vital signs and the nursing notes.   HISTORY  Chief Complaint Annual Exam   HPI Alicia Duffy is a 46 y.o. female patient presents for annual physical exam.  Patient was no complaints or concerns.         Past Medical History:  Diagnosis Date   Allergy    Anemia    d/t heavy menses   Contraception    husband vasectomy   Endometriosis    dx 2007 at time of surhery   GERD (gastroesophageal reflux disease)    Panic attacks    Vaginal septum    [name], OB/GYN    Patient Active Problem List   Diagnosis Date Noted   Atypical cervical glandular cells 09/30/2019   Vaginal septum 09/29/2019   Dyspnea on exertion 08/06/2017   Family history of MI (myocardial infarction) 08/01/2017   Chest pain 07/13/2014   Generalized anxiety disorder 03/05/2014   Endometriosis 03/05/2014    Past Surgical History:  Procedure Laterality Date   CESAREAN SECTION     CHOLECYSTECTOMY     OVARIAN CYST REMOVAL Left 2007    Prior to Admission medications   Medication Sig Start Date End Date Taking? Authorizing Provider  clonazePAM (KLONOPIN) 0.5 MG tablet Take 1 tablet (0.5 mg total) by mouth 2 (two) times daily as needed for anxiety. 08/12/19  Yes Earleen Newport, MD  Cranberry-Vitamin C 15000-100 MG CAPS Take 1 Dose by mouth daily.   Yes [provider]  famotidine (PEPCID) 20 MG tablet Take 1 tablet (20 mg total) by mouth at bedtime. 10/14/19  Yes Milus Banister, MD  ferrous sulfate 324 MG TBEC Take 324 mg by mouth daily.   Yes [provider]  Lactobacillus-Inulin ([place] PO) Take 1 Dose by mouth daily.   Yes [provider]  Multiple Vitamins-Minerals (AIRBORNE GUMMIES PO) Take 1 each by mouth.   Yes [provider]  pantoprazole (PROTONIX) 40 MG tablet Take 1 tablet  (40 mg total) by mouth daily. 02/18/21  Yes Sable Feil, PA-C    Allergies Demerol [meperidine], Hydrocodone-acetaminophen, Hydrocodone-acetaminophen, and Retin-a [tretinoin]  Family History  Problem Relation Age of Onset   Cancer Mother    Heart disease Father    Depression Father    Heart attack Father    Heart attack Brother    Heart attack Paternal Grandfather    Heart attack Brother    Breast cancer Neg Hx    Colon cancer Neg Hx    Rectal cancer Neg Hx    Stomach cancer Neg Hx    Esophageal cancer Neg Hx     Social History Social History   Tobacco Use   Smoking status: Never   Smokeless tobacco: Never  Substance Use Topics   Alcohol use: No   Drug use: No    Review of Systems Constitutional: No fever/chills Eyes: No visual changes. ENT: No sore throat. Cardiovascular: Denies chest pain. Respiratory: Denies shortness of breath. Gastrointestinal: No abdominal pain.  No nausea, no vomiting.  No diarrhea.  No constipation. Genitourinary: Negative for dysuria. Musculoskeletal: Negative for back pain. Skin: Negative for rash. Neurological: Negative for headaches, focal weakness or numbness.  Allergic/Immunilogical: Demerol, hydrocodone, and Retin-A ____________________________________________   PHYSICAL EXAM:  VITAL SIGNS: BP is 110/67, pulse 71, respiration 14, temperature 97.6, and patient is 99% O2 sat on room air.  Patient weighs 136 pounds and BMI is 23.34. Constitutional: Alert and oriented. Well appearing and in no acute distress. Eyes: Conjunctivae are normal. PERRL. EOMI. Head: Atraumatic. Nose: No congestion/rhinnorhea. Mouth/Throat: Mucous membranes are moist.  Oropharynx non-erythematous. Neck: No stridor.  No cervical spine tenderness to palpation. Hematological/Lymphatic/Immunilogical: No cervical lymphadenopathy. Cardiovascular: Normal rate, regular rhythm. Grossly normal heart sounds.  Good peripheral circulation. Respiratory: Normal  respiratory effort.  No retractions. Lungs CTAB. Gastrointestinal: Soft and nontender. No distention. No abdominal bruits. No CVA tenderness. Genitourinary: Deferred Musculoskeletal: No lower extremity tenderness nor edema.  No joint effusions. Neurologic:  Normal speech and language. No gross focal neurologic deficits are appreciated. No gait instability. Skin:  Skin is warm, dry and intact. No rash noted. Psychiatric: Mood and affect are normal. Speech and behavior are normal.  ____________________________________________   LABS           Component Ref Range & Units 7 d ago (07/13/21) 1 yr ago (07/14/20) 6 yr ago (01/29/15) 7 yr ago (03/30/14) 7 yr ago (03/05/14) 9 yr ago (01/16/12)  Color, UA  dark yellow  honey    Yellow    Clarity, UA  cloudy  clear    Clear    Glucose, UA Negative Negative  Negative    Neg R    Bilirubin, UA  negative  negative    Neg    Ketones, UA  negative  positive CM    Neg    Spec Grav, UA 1.010 - 1.025 1.015  1.015    1.020 R    Blood, UA  positive  negative    Trace    Comment: 3+ (menstrual)  pH, UA 5.0 - 8.0 8.0  7.0    7.0 R    Protein, UA Negative Positive Abnormal   Positive Abnormal  CM    Trace R    Comment: trace -+  Urobilinogen, UA 0.2 or 1.0 E.U./dL 0.2  0.2  0.2 R  0.2 R  0.2 R  0.2 R   Nitrite, UA  negative  negative    Neg    Leukocytes, UA Negative Negative  Negative  NEGATIVE  NEGATIVE  Trace R  NEGATIVE R   Appearance   dark  Turbid Abnormal  R  CLEAR R   CLEAR R   Odor          Resulting Agency    [facility]         Specimen Collected: 07/13/21 13:46 Last Resulted: 07/13/21 13:46      Lab Flowsheet    Order Details    View Encounter    Lab and Collection Details    Routing    Result History    View All Conversations on this Encounter      CM=Additional comments  R=Reference range differs from displayed range      Result Care Coordination   Patient Communication   Add  Comments   Seen Back to Top       Other Results from 07/13/2021   Contains abnormal data Executive Panel Order: [id number] Status: Final result    Visible to patient: Yes (seen)    Next appt: None    Dx: Routine adult health maintenance    0 Result Notes           Component Ref Range & Units 7 d ago (07/13/21) 1 yr ago (07/14/20) 1 yr ago (07/14/20) 1 yr ago (08/12/19) 1 yr ago (  08/12/19) 1 yr ago (07/29/19) 6 yr ago (06/07/15)  Glucose 70 - 99 mg/dL 90   88 R  90 R      Uric Acid 2.6 - 6.2 mg/dL 3.0   2.9 CM       Comment:            Therapeutic target for gout patients: <6.0  BUN 6 - 24 mg/dL _0 Creatinine, Ser 0.57 - 1.00 mg/dL 0.70   0.57  0.52 Low       eGFR >59 mL/min/1.73 108   114       BUN/Creatinine Ratio 9 - _1 Sodium 134 - 144 mmol/L 140   140  139      Potassium 3.5 - 5.2 mmol/L 4.1   4.1  3.9      Chloride 96 - 106 mmol/L 103   103  102      Calcium 8.7 - 10.2 mg/dL 9.4   9.2  9.4      Phosphorus 3.0 - 4.3 mg/dL 3.8   3.3       Total Protein 6.0 - 8.5 g/dL 7.3   7.0  7.1      Albumin 3.9 - 4.9 g/dL 4.6   4.7 R  4.7 R      Comment:               **Please note reference interval change**  Globulin, Total 1.5 - 4.5 g/dL 2.7   2.3  2.4      Albumin/Globulin Ratio 1.2 - 2.2 1.7   2.0  2.0      Bilirubin Total 0.0 - 1.2 mg/dL 0.3   0.4  0.4      Alkaline Phosphatase 44 - 121 IU/L 56   57  62 R      LDH 119 - 226 IU/L 162   153       AST 0 - 40 IU/L _2 ALT 0 - 32 IU/L _3 GGT 0 - 60 IU/L 10   12       Iron 27 - 159 ug/dL 81   123       Cholesterol, Total 100 - 199 mg/dL 262 High    231 High        Triglycerides 0 - 149 mg/dL 52   44       HDL >39 mg/dL 78   73       VLDL Cholesterol Cal 5 - 40 mg/dL 8   7       LDL Chol Calc (NIH) 0 - 99 mg/dL 176 High    151 High        Chol/HDL Ratio 0.0 - 4.4 ratio 3.4   3.2 CM       Comment:                                   T. Chol/HDL Ratio                                               Men  Women  1/2 Avg.Risk  3.4    3.3                                    Avg.Risk  5.0    4.4                                 2X Avg.Risk  9.6    7.1                                 3X Avg.Risk 23.4   11.0   Estimated CHD Risk 0.0 - 1.0 times avg.  < 0.5    < 0.5 CM       Comment: The CHD Risk is based on the T. Chol/HDL ratio. Other  factors affect CHD Risk such as hypertension, smoking,  diabetes, severe obesity, and family history of  premature CHD.   TSH 0.450 - 4.500 uIU/mL 2.100   1.770     1.90 R   T4, Total 4.5 - 12.0 ug/dL 7.4   7.5       T3 Uptake Ratio 24 - 39 % 21 Low    24       Free Thyroxine Index 1.2 - 4.9 1.6   1.8       WBC 3.4 - 10.8 x10E3/uL 5.2  4.7  CANCELED R, CM   10.4  4.6    RBC 3.77 - 5.28 x10E6/uL 4.14  4.10  CANCELED R, CM   4.54  4.38    Hemoglobin 11.1 - 15.9 g/dL 12.9  12.9  CANCELED R, CM   14.2  13.9    Hematocrit 34.0 - 46.6 % 38.1  39.6  CANCELED R, CM   43.1  40.6    MCV 79 - 97 fL 92  97    95  93    MCH 26.6 - 33.0 pg 31.2  31.5    31.3  31.7    MCHC 31.5 - 35.7 g/dL 33.9  32.6    32.9  34.2    RDW 11.7 - 15.4 % 12.9  12.6    12.0  11.7    Platelets 150 - 450 x10E3/uL 320  321  CANCELED R, CM   346  302    Neutrophils Not Estab. % 57  59  CANCELED R, CM   81  46    Lymphs Not Estab. % 33  31  CANCELED R, CM   11  42    Monocytes Not Estab. % 8  8  CANCELED R, CM   8  10    Eos Not Estab. % 1  1  CANCELED R, CM   0  1    Basos Not Estab. % 1  1    0  1    Neutrophils Absolute 1.4 - 7.0 x10E3/uL 3.0  2.8    8.3 High   2.1    Lymphocytes Absolute 0.7 - 3.1 x10E3/uL 1.7  1.4  CANCELED R, CM   1.1  1.9    Monocytes Absolute 0.1 - 0.9 x10E3/uL 0.4  0.4    0.8  0.5    EOS (ABSOLUTE) 0.0 - 0.4 x10E3/uL 0.0  0.0  CANCELED R, CM   0.0  0.1    Basophils Absolute 0.0 - 0.2  x10E3/uL 0.1  0.0  CANCELED R, CM   0.0  0.1    Immature Granulocytes Not Estab. % 0  0    0  0    Immature Grans (             ____________________________________________  EKG  _Sinus  Rhythm at 70 bpm. Low voltage in limb leads.   ABNORMAL ___________________________________________    ____________________________________________   INITIAL IMPRESSION / ASSESSMENT AND PLAN  As part of my medical decision making, I reviewed the following data within the [facility]      Discussed lab results and EKG findings with patient.  Patient cholesterol has increased from 2 31-2 62 from last exam.  Patient admits to eating a pint of ice cream every day.  Patient triglycerides and HDLs are in normal range.  Advised to consider moderation in her diet.        ____________________________________________   FINAL CLINICAL IMPRESSION Well exam   ED Discharge Orders     None        Note:  This document was prepared using Dragon voice recognition software and may include unintentional dictation errors.

## 2021-12-28 DIAGNOSIS — J101 Influenza due to other identified influenza virus with other respiratory manifestations: Secondary | ICD-10-CM | POA: Diagnosis not present

## 2022-01-04 ENCOUNTER — Encounter: Payer: Self-pay | Admitting: Physician Assistant

## 2022-01-04 ENCOUNTER — Ambulatory Visit: Payer: Self-pay | Admitting: Physician Assistant

## 2022-01-04 VITALS — BP 112/75 | HR 75 | Temp 97.6°F | Resp 12 | Wt 135.2 lb

## 2022-01-04 DIAGNOSIS — J0100 Acute maxillary sinusitis, unspecified: Secondary | ICD-10-CM

## 2022-01-04 DIAGNOSIS — H6991 Unspecified Eustachian tube disorder, right ear: Secondary | ICD-10-CM

## 2022-01-04 MED ORDER — CLARITHROMYCIN 500 MG PO TABS: 500.0000 mg | ORAL_TABLET | Freq: Two times a day (BID) | ORAL | 0 refills | Status: DC

## 2022-01-04 MED ORDER — NAPROXEN 500 MG PO TABS: 500.0000 mg | ORAL_TABLET | Freq: Two times a day (BID) | ORAL | Status: DC

## 2022-01-04 MED ORDER — BENZONATATE 200 MG PO CAPS: 200.0000 mg | ORAL_CAPSULE | Freq: Two times a day (BID) | ORAL | 0 refills | Status: DC | PRN

## 2022-01-04 MED ORDER — FLUCONAZOLE 150 MG PO TABS: 150.0000 mg | ORAL_TABLET | Freq: Once | ORAL | 0 refills | Status: AC

## 2022-01-04 MED ORDER — FEXOFENADINE-PSEUDOEPHED ER 60-120 MG PO TB12: 1.0000 | ORAL_TABLET | Freq: Two times a day (BID) | ORAL | 0 refills | Status: DC

## 2022-01-04 NOTE — Progress Notes (Signed)
   Subjective: Facial and ear pain    Patient ID: Alicia Duffy, female    DOB: 21-Mar-1975, 47 y.o.   MRN: [id number]  HPI Patient states she was diagnosed clinically with COVID-19, influenza, RSV telephonic encounter.  Patient states she was treated with Tessalon Perles and Flonase.  Patient states increased facial pain, ear pain, decreased hearing right ear, and right upper teeth pain.  Denies fever chills associated with complaint.   Review of Systems Anxiety and endometriosis    Objective:   Physical Exam  BP is 112/75, pulse 75, respiration 12, temperature 97.6, patient 99% O2 sat on room air.  Patient was on 35 pounds BMI is 23.21. HEENT is remarkable for bilateral maxillary guarding, edematous nasal turbinates, and edematous right TM. Neck is supple for lymphadenopathy or bruits. Lungs are clear to auscultation with increased cough with deep inspirations. Heart is regular rate and rhythm.       Assessment & Plan: Sinusitis and eustachian tube dysfunction  Patient given a prescription for Biaxin, Allegra-D, oxygen, Tessalon Perles, and Diflucan.  Patient advised to follow 24 hours telephonically

## 2022-01-04 NOTE — Progress Notes (Signed)
Pt presents today for post flu 14 days. Pt still having clear flem, short of breath on slight activity (coming up stairs) pressure in head making her dizzy and ears feel like she has an infection.

## 2022-02-19 IMAGING — MG MM DIGITAL SCREENING BILAT W/ TOMO AND CAD
8 series · 8 of 24 positions shown · non-contrast
Comparison: Previous exam(s).

CLINICAL DATA: Screening.

EXAM:
DIGITAL SCREENING BILATERAL MAMMOGRAM WITH TOMOSYNTHESIS AND CAD
TECHNIQUE: Bilateral screening digital craniocaudal and mediolateral oblique
mammograms were obtained. Bilateral screening digital breast
tomosynthesis was performed. The images were evaluated with
computer-aided detection.

[R MLO synth-2D]
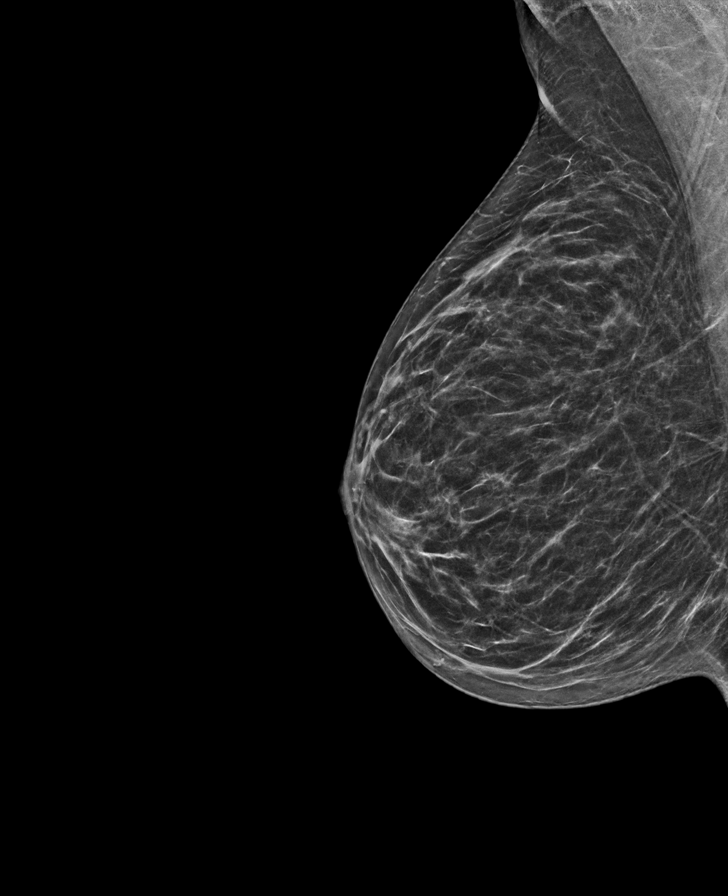

[L MLO synth-2D]
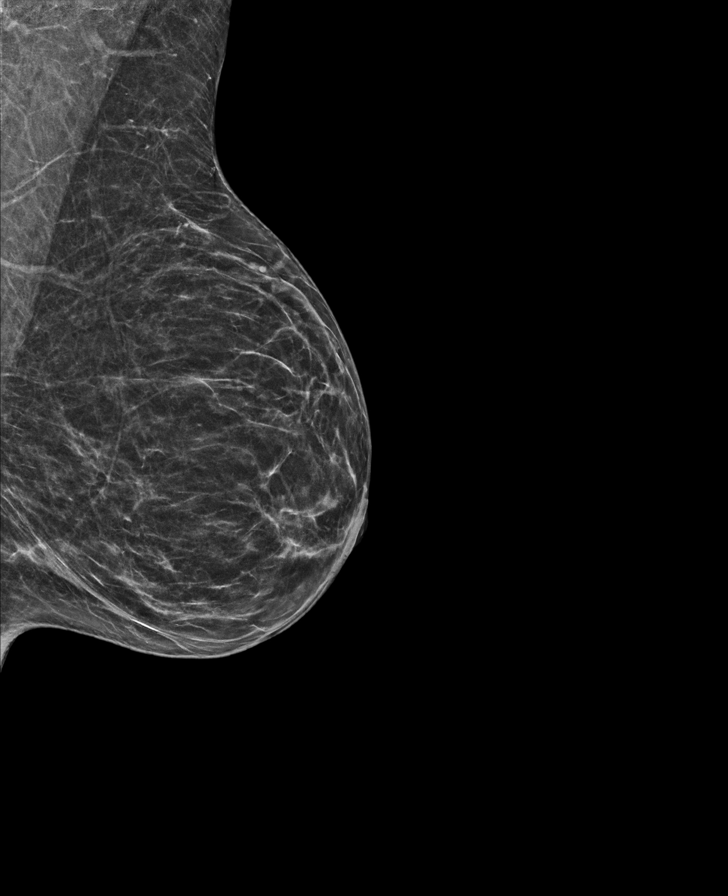

[L CC synth-2D]
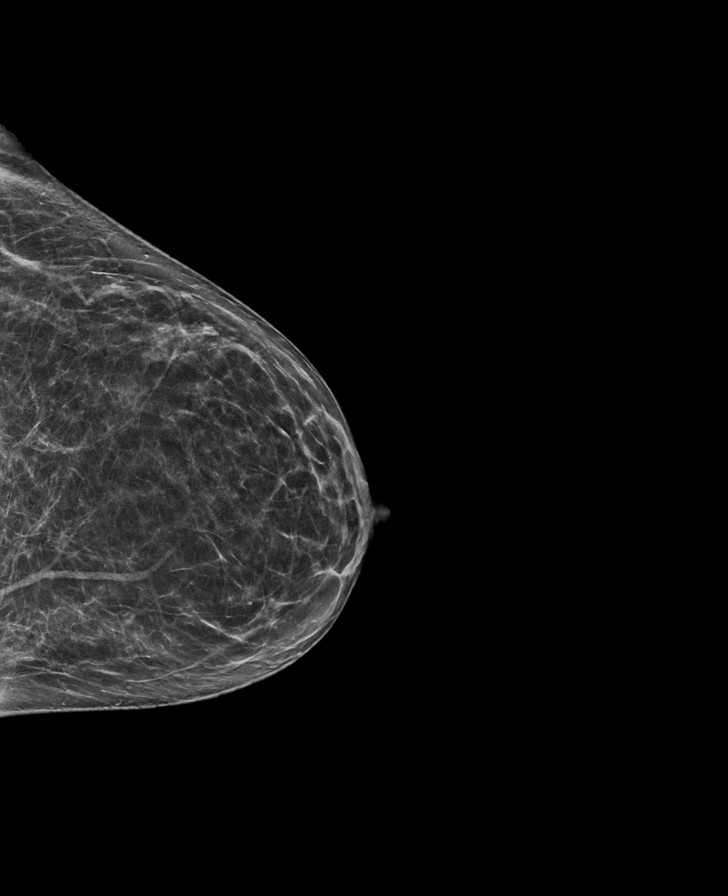

[R CC synth-2D]
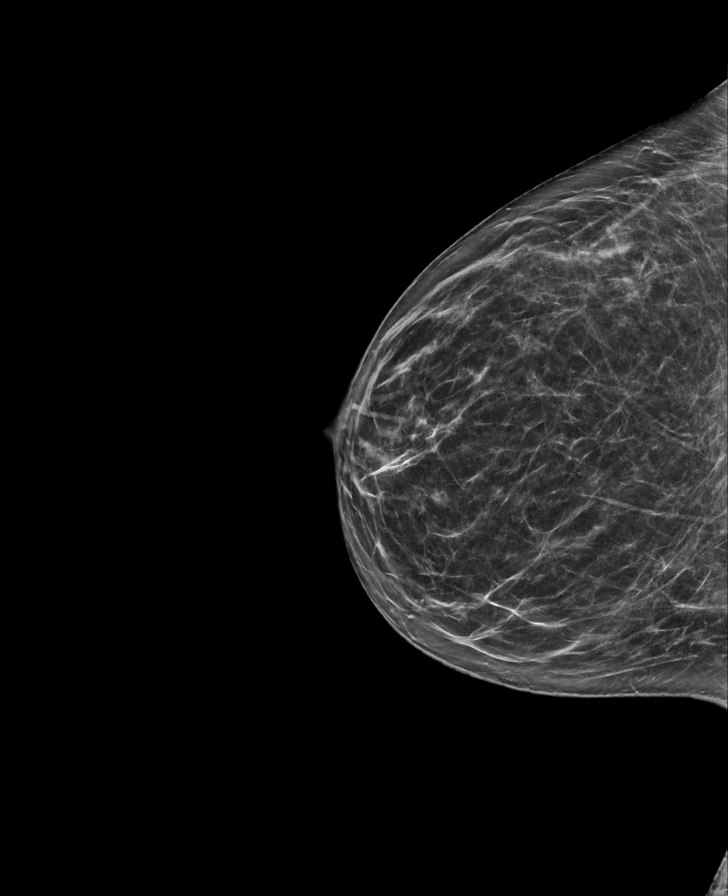

[L MLO tomo · tomo slice 27/54.0]
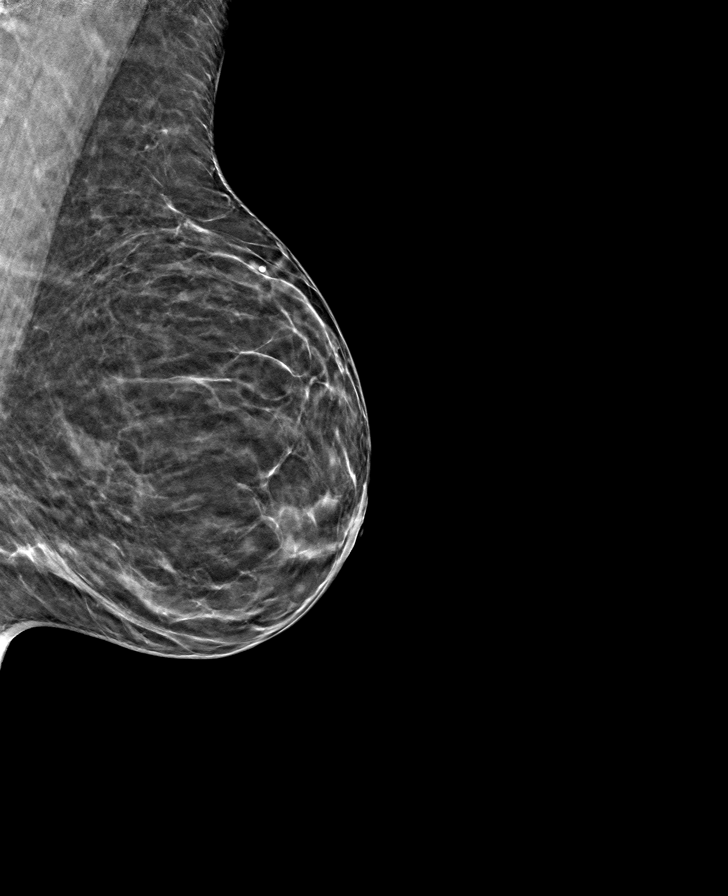

[R CC tomo · tomo slice 33/64.0]
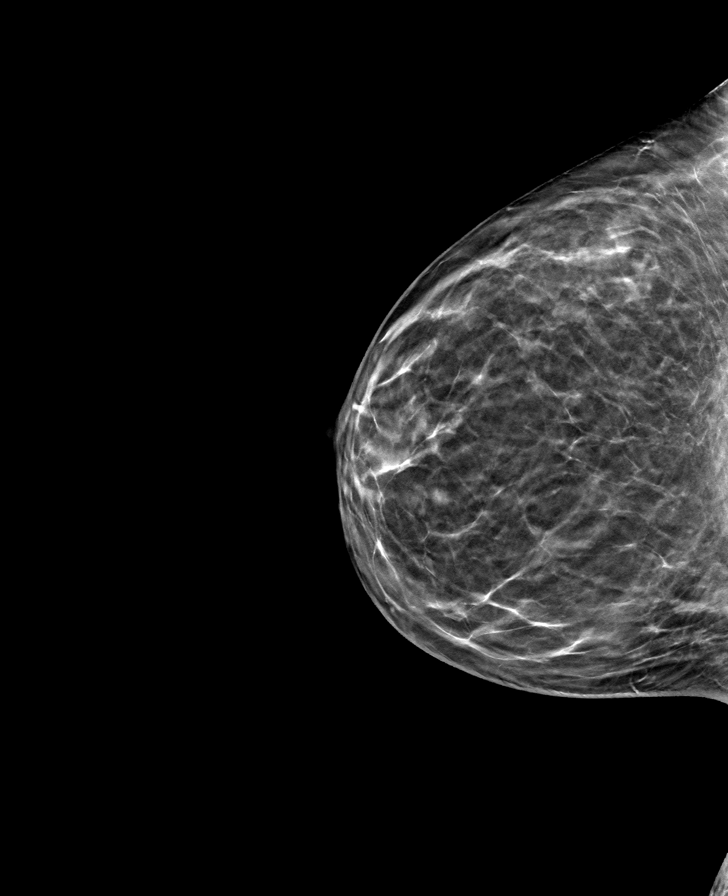

[R MLO tomo · tomo slice 31/60.0]
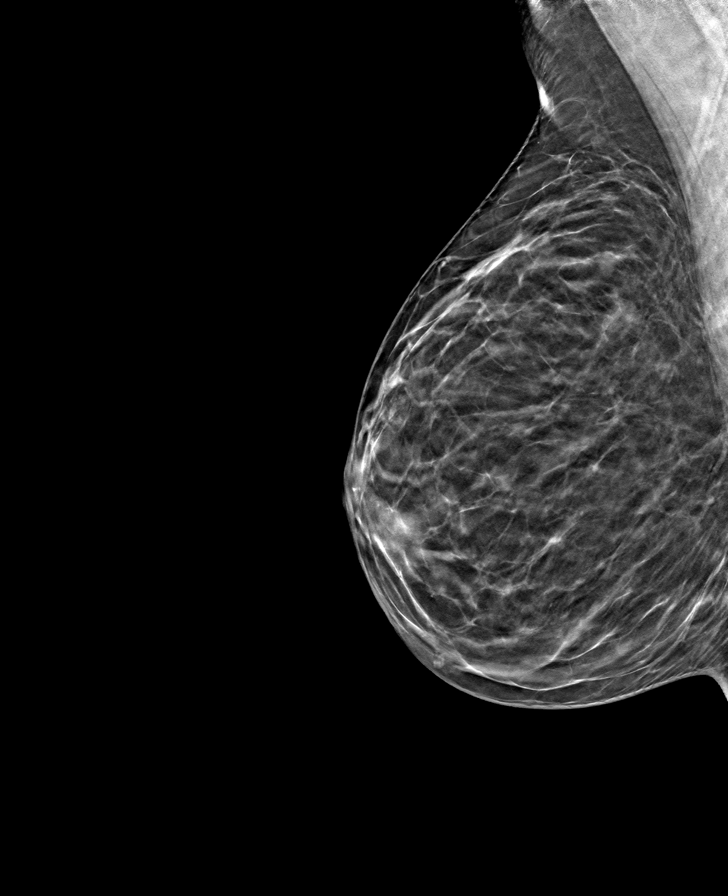

[L CC tomo · tomo slice 32/63.0]
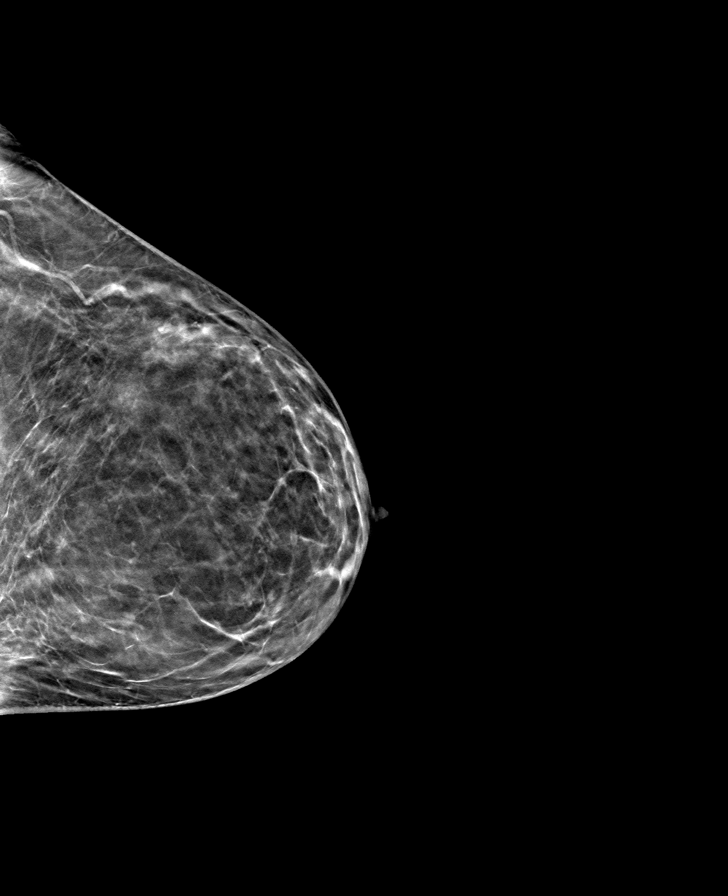

[8 of 24 positions shown; findings below may reference images not displayed]

ACR Breast Density Category b: There are scattered areas of
fibroglandular density.
FINDINGS: There are no findings suspicious for malignancy.
IMPRESSION: No mammographic evidence of malignancy. A result letter of this
screening mammogram will be mailed directly to the patient.

RECOMMENDATION:
Screening mammogram in one year. (Code:51-O-LD2)

BI-RADS CATEGORY  1: Negative.

## 2022-05-24 ENCOUNTER — Other Ambulatory Visit: Payer: Self-pay

## 2022-05-24 DIAGNOSIS — K219 Gastro-esophageal reflux disease without esophagitis: Secondary | ICD-10-CM

## 2022-05-24 MED ORDER — PANTOPRAZOLE SODIUM 40 MG PO TBEC: 40.0000 mg | DELAYED_RELEASE_TABLET | Freq: Every day | ORAL | 3 refills | Status: DC

## 2022-06-02 ENCOUNTER — Other Ambulatory Visit: Payer: Self-pay | Admitting: Physician Assistant

## 2022-06-02 DIAGNOSIS — Z1231 Encounter for screening mammogram for malignant neoplasm of breast: Secondary | ICD-10-CM

## 2022-07-14 ENCOUNTER — Ambulatory Visit: Payer: Self-pay

## 2022-07-14 DIAGNOSIS — R5383 Other fatigue: Secondary | ICD-10-CM

## 2022-07-14 DIAGNOSIS — Z0000 Encounter for general adult medical examination without abnormal findings: Secondary | ICD-10-CM

## 2022-07-14 LAB — POCT URINALYSIS DIPSTICK
Bilirubin, UA: NEGATIVE
Blood, UA: NEGATIVE
Glucose, UA: NEGATIVE
Ketones, UA: NEGATIVE
Leukocytes, UA: NEGATIVE
Nitrite, UA: NEGATIVE
Protein, UA: POSITIVE — AB
Spec Grav, UA: 1.015 (ref 1.010–1.025)
Urobilinogen, UA: 0.2 E.U./dL
pH, UA: 6 (ref 5.0–8.0)

## 2022-07-14 NOTE — Progress Notes (Signed)
Pt completed labs for annual physical. 

## 2022-07-15 LAB — CMP12+LP+TP+TSH+6AC+CBC/D/PLT
ALT: 24 IU/L (ref 0–32)
AST: 17 IU/L (ref 0–40)
Albumin: 4.4 g/dL (ref 3.9–4.9)
Alkaline Phosphatase: 77 IU/L (ref 44–121)
BUN/Creatinine Ratio: 18 (ref 9–23)
BUN: 12 mg/dL (ref 6–24)
Basophils Absolute: 0.1 10*3/uL (ref 0.0–0.2)
Basos: 1 %
Bilirubin Total: 0.3 mg/dL (ref 0.0–1.2)
Calcium: 9.6 mg/dL (ref 8.7–10.2)
Chloride: 100 mmol/L (ref 96–106)
Chol/HDL Ratio: 4.1 ratio (ref 0.0–4.4)
Cholesterol, Total: 302 mg/dL — ABNORMAL HIGH (ref 100–199)
Creatinine, Ser: 0.66 mg/dL (ref 0.57–1.00)
EOS (ABSOLUTE): 0.1 10*3/uL (ref 0.0–0.4)
Eos: 1 %
Estimated CHD Risk: 0.9 times avg. (ref 0.0–1.0)
Free Thyroxine Index: 1.9 (ref 1.2–4.9)
GGT: 32 IU/L (ref 0–60)
Globulin, Total: 2.9 g/dL (ref 1.5–4.5)
Glucose: 84 mg/dL (ref 70–99)
HDL: 73 mg/dL (ref >39)
Hematocrit: 41.1 % (ref 34.0–46.6)
Hemoglobin: 13.4 g/dL (ref 11.1–15.9)
Immature Grans (Abs): 0 10*3/uL (ref 0.0–0.1)
Immature Granulocytes: 0 %
Iron: 101 ug/dL (ref 27–159)
LDH: 149 IU/L (ref 119–226)
LDL Chol Calc (NIH): 198 mg/dL — ABNORMAL HIGH (ref 0–99)
Lymphocytes Absolute: 3 10*3/uL (ref 0.7–3.1)
Lymphs: 39 %
MCH: 30.5 pg (ref 26.6–33.0)
MCHC: 32.6 g/dL (ref 31.5–35.7)
MCV: 94 fL (ref 79–97)
Monocytes Absolute: 0.7 10*3/uL (ref 0.1–0.9)
Monocytes: 9 %
Neutrophils Absolute: 3.9 10*3/uL (ref 1.4–7.0)
Neutrophils: 50 %
Phosphorus: 3.8 mg/dL (ref 3.0–4.3)
Platelets: 380 10*3/uL (ref 150–450)
Potassium: 4 mmol/L (ref 3.5–5.2)
RBC: 4.39 x10E6/uL (ref 3.77–5.28)
RDW: 12.7 % (ref 11.7–15.4)
Sodium: 138 mmol/L (ref 134–144)
T3 Uptake Ratio: 26 % (ref 24–39)
T4, Total: 7.4 ug/dL (ref 4.5–12.0)
TSH: 4.44 u[IU]/mL (ref 0.450–4.500)
Total Protein: 7.3 g/dL (ref 6.0–8.5)
Triglycerides: 169 mg/dL — ABNORMAL HIGH (ref 0–149)
Uric Acid: 3.1 mg/dL (ref 2.6–6.2)
VLDL Cholesterol Cal: 31 mg/dL (ref 5–40)
WBC: 7.8 10*3/uL (ref 3.4–10.8)
eGFR: 109 mL/min/{1.73_m2} (ref >59)

## 2022-07-15 LAB — VITAMIN D 25 HYDROXY (VIT D DEFICIENCY, FRACTURES): Vit D, 25-Hydroxy: 29.8 ng/mL — ABNORMAL LOW (ref 30.0–100.0)

## 2022-07-15 LAB — VITAMIN B12: Vitamin B-12: 234 pg/mL (ref 232–1245)

## 2022-07-15 LAB — MAGNESIUM: Magnesium: 2.1 mg/dL (ref 1.6–2.3)

## 2022-07-17 ENCOUNTER — Other Ambulatory Visit: Payer: Self-pay | Admitting: Physician Assistant

## 2022-07-19 ENCOUNTER — Encounter: Payer: Self-pay | Admitting: Physician Assistant

## 2022-07-19 ENCOUNTER — Other Ambulatory Visit: Payer: Self-pay

## 2022-07-19 ENCOUNTER — Ambulatory Visit: Payer: Self-pay | Admitting: Physician Assistant

## 2022-07-19 VITALS — BP 110/75 | HR 79 | Resp 16

## 2022-07-19 DIAGNOSIS — E559 Vitamin D deficiency, unspecified: Secondary | ICD-10-CM

## 2022-07-19 DIAGNOSIS — R801 Persistent proteinuria, unspecified: Secondary | ICD-10-CM

## 2022-07-19 DIAGNOSIS — Z0000 Encounter for general adult medical examination without abnormal findings: Secondary | ICD-10-CM

## 2022-07-19 DIAGNOSIS — E782 Mixed hyperlipidemia: Secondary | ICD-10-CM

## 2022-07-19 DIAGNOSIS — R413 Other amnesia: Secondary | ICD-10-CM

## 2022-07-19 MED ORDER — VITAMIN D (ERGOCALCIFEROL) 1.25 MG (50000 UNIT) PO CAPS: 50000.0000 [IU] | ORAL_CAPSULE | ORAL | 3 refills | Status: AC

## 2022-07-19 MED ORDER — ROSUVASTATIN CALCIUM 40 MG PO TABS: 40.0000 mg | ORAL_TABLET | Freq: Every day | ORAL | 3 refills | Status: DC

## 2022-07-19 NOTE — Addendum Note (Signed)
Addended by: Gardner Candle on: 07/19/2022 04:11 PM   Modules accepted: Orders

## 2022-07-19 NOTE — Progress Notes (Signed)
[facility] ____________________________________________   None    (approximate)  I have reviewed the triage vital signs and the nursing notes.   HISTORY  Chief Complaint Annual Exam   HPI Alicia Duffy is a 47 y.o. female presents for annual physical exam.  Patient voiced concern for elevated lipid profile, decreased vitamin D levels, and increased for TSH which is in normal limits.         Past Medical History:  Diagnosis Date   Allergy    Anemia    d/t heavy menses   Contraception    husband vasectomy   Endometriosis    dx 2007 at time of surhery   GERD (gastroesophageal reflux disease)    Panic attacks    Vaginal septum    Julio Sicks, OB/GYN    Patient Active Problem List   Diagnosis Date Noted   Atypical cervical glandular cells 09/30/2019   Vaginal septum 09/29/2019   Dyspnea on exertion 08/06/2017   Family history of MI (myocardial infarction) 08/01/2017   Chest pain 07/13/2014   Generalized anxiety disorder 03/05/2014   Endometriosis 03/05/2014    Past Surgical History:  Procedure Laterality Date   CESAREAN SECTION     CHOLECYSTECTOMY     OVARIAN CYST REMOVAL Left 2007    Prior to Admission medications   Medication Sig Start Date End Date Taking? Authorizing Provider  Azelastine-Fluticasone 137-50 MCG/ACT SUSP Place 1 spray into both nostrils 2 (two) times daily. 12/28/21   [provider]  benzonatate (TESSALON) 200 MG capsule Take 1 capsule (200 mg total) by mouth 2 (two) times daily as needed for cough. 01/04/22   Joni Reining, PA-C  clarithromycin (BIAXIN) 500 MG tablet Take 1 tablet (500 mg total) by mouth 2 (two) times daily. 01/04/22   Joni Reining, PA-C  clonazePAM (KLONOPIN) 0.5 MG tablet Take 1 tablet (0.5 mg total) by mouth 2 (two) times daily as needed for anxiety. 08/12/19   Emily Filbert, MD  Cranberry-Vitamin C 15000-100 MG CAPS Take 1 Dose by mouth daily.    [provider]  famotidine (PEPCID) 20 MG tablet Take 1 tablet (20 mg total) by mouth at bedtime. 10/14/19   Rachael Fee, MD  fexofenadine-pseudoephedrine (ALLEGRA-D) 60-120 MG 12 hr tablet Take 1 tablet by mouth 2 (two) times daily. 01/04/22   Joni Reining, PA-C  ibuprofen (ADVIL) 800 MG tablet Take 800 mg by mouth every 6 (six) hours as needed. 12/28/21   [provider]  Lactobacillus-Inulin (CULTURELLE DIGESTIVE HEALTH PO) Take 1 Dose by mouth daily.    [provider]  Multiple Vitamins-Minerals (AIRBORNE GUMMIES PO) Take 1 each by mouth.    [provider]  naproxen (NAPROSYN) 500 MG tablet Take 1 tablet (500 mg total) by mouth 2 (two) times daily with a meal. 01/04/22   Joni Reining, PA-C  pantoprazole (PROTONIX) 40 MG tablet Take 1 tablet (40 mg total) by mouth daily. 05/24/22   Joni Reining, PA-C    Allergies Demerol [meperidine], Hydrocodone-acetaminophen, Hydrocodone-acetaminophen, and Retin-a [tretinoin]  Family History  Problem Relation Age of Onset   Cancer Mother    Heart disease Father    Depression Father    Heart attack Father    Heart attack Brother    Heart attack Paternal Grandfather    Heart attack Brother    Breast cancer Neg Hx    Colon cancer Neg Hx    Rectal cancer Neg Hx  Stomach cancer Neg Hx    Esophageal cancer Neg Hx     Social History Social History   Tobacco Use   Smoking status: Never   Smokeless tobacco: Never  Substance Use Topics   Alcohol use: No   Drug use: No    Review of Systems Constitutional: No fever/chills Eyes: No visual changes. ENT: No sore throat. Cardiovascular: Denies chest pain. Respiratory: Denies shortness of breath. Gastrointestinal: No abdominal pain.  No nausea, no vomiting.  No diarrhea.  No constipation.  GERD Genitourinary: Negative for dysuria. Musculoskeletal: Negative for back pain. Skin: Negative for rash. Neurological: Negative for headaches, focal weakness or  numbness. Psychiatric: Anxiety Allergic/Immunilogical: Demerol, hydrocodone, and Retin-A ____________________________________________   PHYSICAL EXAM:  VITAL SIGNS: Constitutional: Alert and oriented. Well appearing and in no acute distress. Eyes: Conjunctivae are normal. PERRL. EOMI. Head: Atraumatic. Nose: No congestion/rhinnorhea. Mouth/Throat: Mucous membranes are moist.  Oropharynx non-erythematous. Neck: No stridor.  No cervical spine tenderness to palpation. Hematological/Lymphatic/Immunilogical: No cervical lymphadenopathy. Cardiovascular: Normal rate, regular rhythm. Grossly normal heart sounds.  Good peripheral circulation. Respiratory: Normal respiratory effort.  No retractions. Lungs CTAB. Gastrointestinal: Soft and nontender. No distention. No abdominal bruits. No CVA tenderness. Genitourinary: Deferred Musculoskeletal: No lower extremity tenderness nor edema.  No joint effusions. Neurologic:  Normal speech and language. No gross focal neurologic deficits are appreciated. No gait instability. Skin:  Skin is warm, dry and intact. No rash noted. Psychiatric: Mood and affect are normal. Speech and behavior are normal.  ____________________________________________   LABS           Component Ref Range & Units 5 d ago (07/14/22) 1 yr ago (07/13/21) 2 yr ago (07/14/20) 7 yr ago (01/29/15) 8 yr ago (03/30/14) 8 yr ago (03/05/14) 10 yr ago (01/16/12)  Color, UA yellow dark yellow honey   Yellow   Clarity, UA clear cloudy clear   Clear   Glucose, UA Negative Negative Negative Negative   Neg R   Bilirubin, UA neg negative negative   Neg   Ketones, UA neg negative positive CM   Neg   Spec Grav, UA 1.010 - 1.025 1.015 1.015 1.015   1.020 R   Blood, UA neg positive CM negative   Trace   pH, UA 5.0 - 8.0 6.0 8.0 7.0   7.0 R   Protein, UA Negative Positive Abnormal  Positive Abnormal  CM Positive Abnormal  CM   Trace R   Comment: trace -+  Urobilinogen, UA 0.2 or 1.0  E.U./dL 0.2 0.2 0.2 0.2 R 0.2 R 0.2 R 0.2 R  Nitrite, UA neg negative negative   Neg   Leukocytes, UA Negative Negative Negative Negative NEGATIVE NEGATIVE Trace R NEGATIVE R  Appearance   dark Turbid Abnormal  R CLEAR R  CLEAR R  Odor         Resulting Agency    [facility]                      Component Ref Range & Units 5 d ago (07/14/22) 1 yr ago (07/13/21) 2 yr ago (07/14/20) 2 yr ago (07/14/20) 2 yr ago (08/12/19) 2 yr ago (08/12/19) 2 yr ago (07/29/19)  Glucose 70 - 99 mg/dL 84 90  88 R 90 R    Uric Acid 2.6 - 6.2 mg/dL 3.1 3.0 CM  2.9 CM     Comment:  Therapeutic target for gout patients: <6.0  BUN 6 - 24 mg/dL 12 12  12 9     Creatinine, Ser 0.57 - 1.00 mg/dL 1.61 0.96  0.45 4.09 Low     eGFR >59 mL/min/1.73 109 108  114     BUN/Creatinine Ratio 9 - 23 18 17  21 17     Sodium 134 - 144 mmol/L 138 140  140 139    Potassium 3.5 - 5.2 mmol/L 4.0 4.1  4.1 3.9    Chloride 96 - 106 mmol/L 100 103  103 102    Calcium 8.7 - 10.2 mg/dL 9.6 9.4  9.2 9.4    Phosphorus 3.0 - 4.3 mg/dL 3.8 3.8  3.3     Total Protein 6.0 - 8.5 g/dL 7.3 7.3  7.0 7.1    Albumin 3.9 - 4.9 g/dL 4.4 4.6 CM  4.7 R 4.7 R    Globulin, Total 1.5 - 4.5 g/dL 2.9 2.7  2.3 2.4    Bilirubin Total 0.0 - 1.2 mg/dL 0.3 0.3  0.4 0.4    Alkaline Phosphatase 44 - 121 IU/L 77 56  57 62 R    LDH 119 - 226 IU/L 149 162  153     AST 0 - 40 IU/L 17 16  16 9     ALT 0 - 32 IU/L 24 10  13 10     GGT 0 - 60 IU/L 32 10  12     Iron 27 - 159 ug/dL 811 81  914     Cholesterol, Total 100 - 199 mg/dL 782 High  956 High   213 High      Triglycerides 0 - 149 mg/dL 086 High  52  44     HDL >39 mg/dL 73 78  73     VLDL Cholesterol Cal 5 - 40 mg/dL 31 8  7      LDL Chol Calc (NIH) 0 - 99 mg/dL 578 High  469 High   629 High      LDL CALC COMMENT: Comment        Comment: Consider evaluating for Familial Hypercholesterolemia(FH), if clinically indicated.   Chol/HDL Ratio 0.0 - 4.4 ratio 4.1 3.4 CM  3.2 CM     Comment:                                   T. Chol/HDL Ratio                                             Men  Women                               1/2 Avg.Risk  3.4    3.3                                   Avg.Risk  5.0    4.4                                2X Avg.Risk  9.6    7.1  3X Avg.Risk 23.4   11.0  Estimated CHD Risk 0.0 - 1.0 times avg. 0.9  < 0.5 CM   < 0.5 CM     Comment: The CHD Risk is based on the T. Chol/HDL ratio. Other factors affect CHD Risk such as hypertension, smoking, diabetes, severe obesity, and family history of premature CHD.  TSH 0.450 - 4.500 uIU/mL 4.440 2.100  1.770     T4, Total 4.5 - 12.0 ug/dL 7.4 7.4  7.5     T3 Uptake Ratio 24 - 39 % 26 21 Low   24     Free Thyroxine Index 1.2 - 4.9 1.9 1.6  1.8     WBC 3.4 - 10.8 x10E3/uL 7.8 5.2 4.7 CANCELED R, CM  10.4 4.6  RBC 3.77 - 5.28 x10E6/uL 4.39 4.14 4.10 CANCELED R, CM  4.54 4.38  Hemoglobin 11.1 - 15.9 g/dL 16.1 09.6 04.5 CANCELED R, CM  14.2 13.9  Hematocrit 34.0 - 46.6 % 41.1 38.1 39.6 CANCELED R, CM  43.1 40.6  MCV 79 - 97 fL 94 92 97   95 93  MCH 26.6 - 33.0 pg 30.5 31.2 31.5   31.3 31.7  MCHC 31.5 - 35.7 g/dL 40.9 81.1 91.4   78.2 95.6  RDW 11.7 - 15.4 % 12.7 12.9 12.6   12.0 11.7  Platelets 150 - 450 x10E3/uL 380 320 321 CANCELED R, CM  346 302  Neutrophils Not Estab. % 50 57 59 CANCELED R, CM  81 46  Lymphs Not Estab. % 39 33 31 CANCELED R, CM  11 42  Monocytes Not Estab. % 9 8 8  CANCELED R, CM  8 10  Eos Not Estab. % 1 1 1  CANCELED R, CM  0 1  Basos Not Estab. % 1 1 1    0 1  Neutrophils Absolute 1.4 - 7.0 x10E3/uL 3.9 3.0 2.8   8.3 High  2.1  Lymphocytes Absolute 0.7 - 3.1 x10E3/uL 3.0 1.7 1.4 CANCELED R, CM  1.1 1.9  Monocytes Absolute 0.1 - 0.9 x10E3/uL 0.7 0.4 0.4   0.8 0.5  EOS (ABSOLUTE) 0.0 - 0.4 x10E3/uL 0.1 0.0 0.0 CANCELED R, CM  0.0 0.1  Basophils Absolute 0.0 - 0.2 x10E3/uL  0.1 0.1 0.0 CANCELED R, CM  0.0 0.1  Immature Granulocytes Not Estab. % 0 0 0   0 0  Immature Grans (Abs) 0.0 - 0.1 x10E3/uL 0.0 0.0 0.0   0.0 0.0  [facility]ing Agency [facility]               Magnesium Order: [id number] Status: Final result           Component Ref Range & Units 5 d ago  Magnesium 1.6 - 2.3 mg/dL 2.1  Resulting Agency [facility]         Narrative Performed by: Verdell Carmine Performed at:  [address] [facility] [address], [place], [place]  [id number] Lab Director: Jolene Schimke MD, Phone:  [phone]         View All Conversations on this Encounter            Seen Back to Top      Vitamin B12 Order: [id number] Status: Final result            Component Ref Range & Units 5 d ago 1 yr ago  Vitamin B-12 232 - 1,245 pg/mL 234 191 Low   [facility]ing Agency [facility]  Contains abnormal data VITAMIN D 25 Hydroxy (Vit-D Deficiency, Fractures) Order: [id number]       Component Ref Range & Units 5 d ago 1 yr ago  Vit D, 25-Hydroxy 30.0 - 100.0 ng/mL 29.8 Low  31.2 CM  Comment: Vitamin D deficiency has been defined by the Institute of Medicine and an Endocrine Society practice guideline as a level of serum 25-OH vitamin D less than 20 ng/mL (1,2). The Endocrine Society went on to further define vitamin D insufficiency as a level between 21 and 29 ng/mL (2). 1. IOM (Institute of Medicine). 2010. Dietary reference    intakes for calcium and D. [place]: The    [employer]. 2. Holick MF, Binkley Brownton, Bischoff-Ferrari HA, et al.    Evaluation, treatment, and prevention of vitamin D    deficiency: an Endocrine Society clinical practice          ____________________________________________  EKG  Normal sinus rhythm at 60 bpm ____________________________________________    ____________________________________________   INITIAL IMPRESSION /  ASSESSMENT AND PLAN / ED COURSE  As part of my medical decision making, I reviewed the following data within the electronic MEDICAL RECORD NUMBER       No acute findings on physical exam.  Discussed abnormal lab results.  Patient given a prescription for vitamin D and Crestor.  Patient will follow-up in 3 months for fasting lipids, LFT, and vitamin D levels.     ____________________________________________   FINAL CLINICAL IMPRESSION   Well exam   ED Discharge Orders     None        Note:  This document was prepared using Dragon voice recognition software and may include unintentional dictation errors.

## 2022-07-19 NOTE — Progress Notes (Signed)
Neurology Referral faxed to Dr. Malvin Johns of [facility].  Fax #  [phone]  Nephrology Referral faxed to [facility].  Fax # [phone]  AMD

## 2022-07-19 NOTE — Progress Notes (Signed)
Pt presents today to complete physical, Pt has list of concerns she want to go over also pt is having extreme fatigue.

## 2022-07-24 ENCOUNTER — Ambulatory Visit: Admission: RE | Admit: 2022-07-24 | Payer: 59 | Source: Ambulatory Visit

## 2022-07-24 DIAGNOSIS — Z1231 Encounter for screening mammogram for malignant neoplasm of breast: Secondary | ICD-10-CM

## 2022-09-25 ENCOUNTER — Telehealth: Payer: Self-pay

## 2022-09-25 ENCOUNTER — Ambulatory Visit: Payer: 59 | Admitting: Physician Assistant

## 2022-09-25 NOTE — Telephone Encounter (Signed)
Called Nieasha back to see how she did taking Crestor after eating dinner rather than right before going to bed.  She called on Friday C/O right sided back pain radiating to right side of abdomen that was waking her up at night & making her feel sick on her stomach.  She had talked with her pharmacist who said it might be side effect of Crestor.  Had scheduled her to see Durward Parcel, PA-C today, but called back & had to cancel the appointment.  She states she took the Crestor after dinner the past three evenings & hasn't had the discomfort she was experiencing.  She's scheduled to come to clinic next month for follow-up labs for the Crestor.  States she will call us back is she starts to experience anymore of the abd/back pain.  AMD

## 2022-10-19 ENCOUNTER — Other Ambulatory Visit: Payer: 59

## 2022-10-20 ENCOUNTER — Other Ambulatory Visit: Payer: Self-pay

## 2022-10-20 DIAGNOSIS — E782 Mixed hyperlipidemia: Secondary | ICD-10-CM

## 2022-10-20 DIAGNOSIS — R801 Persistent proteinuria, unspecified: Secondary | ICD-10-CM

## 2022-10-20 DIAGNOSIS — E559 Vitamin D deficiency, unspecified: Secondary | ICD-10-CM

## 2022-10-20 LAB — POCT URINALYSIS DIPSTICK
Bilirubin, UA: NEGATIVE
Blood, UA: NEGATIVE
Glucose, UA: NEGATIVE
Ketones, UA: NEGATIVE
Leukocytes, UA: NEGATIVE
Nitrite, UA: NEGATIVE
Protein, UA: NEGATIVE
Spec Grav, UA: <=1.005 — AB (ref 1.010–1.025)
Urobilinogen, UA: 0.2 U/dL
pH, UA: 6.5 (ref 5.0–8.0)

## 2022-10-20 NOTE — Progress Notes (Signed)
3 month follow-up labs from physical requested by Durward Parcel, PA-C.  Sharette requested a UA.  States Nephrologist contacted her & she has an appointment scheduled.  Explained that because of one of her jobs, she doesn't eat or drink all day & she works it usually the day before she comes to the clinic for labs for physical.  She said she knows she's dehydrated & thinks it might be causing her to have protein in her urine (past 3 years).  States she has been drinking water the past couple of days & this morning. UA is negative for protein & was pale yellow in color. States she's going to cancel the Nephrology appointment.  AMD

## 2022-10-25 LAB — LIPID PANEL W/O CHOL/HDL RATIO
Cholesterol, Total: 163 mg/dL (ref 100–199)
HDL: 88 mg/dL (ref >39)
LDL Chol Calc (NIH): 64 mg/dL (ref 0–99)
Triglycerides: 55 mg/dL (ref 0–149)
VLDL Cholesterol Cal: 11 mg/dL (ref 5–40)

## 2022-10-25 LAB — VITAMIN D 25 HYDROXY (VIT D DEFICIENCY, FRACTURES): Vit D, 25-Hydroxy: 52.9 ng/mL (ref 30.0–100.0)

## 2022-10-25 LAB — HEPATIC FUNCTION PANEL
ALT: 17 IU/L (ref 0–32)
AST: 19 IU/L (ref 0–40)
Albumin: 4.5 g/dL (ref 3.9–4.9)
Alkaline Phosphatase: 68 IU/L (ref 44–121)
Bilirubin Total: 0.6 mg/dL (ref 0.0–1.2)
Bilirubin, Direct: 0.2 mg/dL (ref 0.00–0.40)
Total Protein: 7.2 g/dL (ref 6.0–8.5)

## 2022-10-25 LAB — SPECIMEN STATUS REPORT

## 2022-11-27 ENCOUNTER — Other Ambulatory Visit: Payer: Self-pay | Admitting: Physician Assistant

## 2023-04-12 ENCOUNTER — Emergency Department (HOSPITAL_BASED_OUTPATIENT_CLINIC_OR_DEPARTMENT_OTHER)
Admission: EM | Admit: 2023-04-12 | Discharge: 2023-04-12 | Disposition: A | Discharge status: Home / Self Care | Payer: Self-pay | Attending: Emergency Medicine | Admitting: Emergency Medicine

## 2023-04-12 ENCOUNTER — Other Ambulatory Visit: Payer: Self-pay

## 2023-04-12 ENCOUNTER — Encounter (HOSPITAL_BASED_OUTPATIENT_CLINIC_OR_DEPARTMENT_OTHER): Payer: Self-pay | Admitting: Emergency Medicine

## 2023-04-12 DIAGNOSIS — Z7721 Contact with and (suspected) exposure to potentially hazardous body fluids: Secondary | ICD-10-CM | POA: Diagnosis present

## 2023-04-12 LAB — HEPATITIS B SURFACE ANTIGEN: Hepatitis B Surface Ag: NONREACTIVE

## 2023-04-12 LAB — RAPID HIV SCREEN (HIV 1/2 AB+AG)
HIV 1/2 Antibodies: NONREACTIVE
HIV-1 P24 Antigen - HIV24: NONREACTIVE

## 2023-04-12 NOTE — Discharge Instructions (Addendum)
 You were tested for HIV, hepatitis B and C.  Your HIV was negative, the rest of testing should be available via Mychart by tomorrow.

## 2023-04-12 NOTE — ED Triage Notes (Signed)
 States was at dentist and the assistant cut her finger while performing oral care. Patient is requesting to "be tested since her blood went in my mouth"

## 2023-04-12 NOTE — ED Provider Notes (Signed)
 [facility] Provider Note   CSN: [id number] Arrival date & time: 04/12/23  1144     History  Chief Complaint  Patient presents with   Body Fluid Exposure    Alicia Duffy is a 48 y.o. female.  48 y.o female with no PMH presents to the ED with chief complaint of fluid body exposure x today. Patient was at the dentist office getting a crown placed, was evaluated dental assistant must have caught her finger on the crown, she reports that the dentist removed her bed as it blood present.  She reports a Sales executive that was removed from the procedure.  She was told that she needed to go get tested, however the dental assistant went and got stitches to her wound according to staff.  She is concerned as " blood was in my mouth ".  She denies any symptoms at this time.  The history is provided by the patient.  Body Fluid Exposure      Home Medications Prior to Admission medications   Medication Sig Start Date End Date Taking? Authorizing Provider  Azelastine-Fluticasone 137-50 MCG/ACT SUSP Place 1 spray into both nostrils 2 (two) times daily. 12/28/21   [provider]  benzonatate (TESSALON) 200 MG capsule Take 1 capsule (200 mg total) by mouth 2 (two) times daily as needed for cough. 01/04/22   Joni Reining, PA-C  clarithromycin (BIAXIN) 500 MG tablet Take 1 tablet (500 mg total) by mouth 2 (two) times daily. 01/04/22   Joni Reining, PA-C  clonazePAM (KLONOPIN) 0.5 MG tablet Take 1 tablet (0.5 mg total) by mouth 2 (two) times daily as needed for anxiety. 08/12/19   Emily Filbert, MD  Cranberry-Vitamin C 15000-100 MG CAPS Take 1 Dose by mouth daily.    [provider]  famotidine (PEPCID) 20 MG tablet Take 1 tablet (20 mg total) by mouth at bedtime. 10/14/19   Rachael Fee, MD  fexofenadine-pseudoephedrine (ALLEGRA-D) 60-120 MG 12 hr tablet Take 1 tablet by mouth 2 (two) times daily. 01/04/22   Joni Reining, PA-C  ibuprofen (ADVIL) 800 MG tablet Take 800 mg by mouth every 6 (six) hours as needed. 12/28/21   [provider]  Lactobacillus-Inulin (CULTURELLE DIGESTIVE HEALTH PO) Take 1 Dose by mouth daily.    [provider]  Multiple Vitamins-Minerals (AIRBORNE GUMMIES PO) Take 1 each by mouth.    [provider]  naproxen (NAPROSYN) 500 MG tablet Take 1 tablet (500 mg total) by mouth 2 (two) times daily with a meal. 01/04/22   Joni Reining, PA-C  pantoprazole (PROTONIX) 40 MG tablet Take 1 tablet (40 mg total) by mouth daily. 05/24/22   Joni Reining, PA-C  rosuvastatin (CRESTOR) 40 MG tablet Take 1 tablet (40 mg total) by mouth daily. 07/19/22   Joni Reining, PA-C  Vitamin D, Ergocalciferol, (DRISDOL) 1.25 MG (50000 UNIT) CAPS capsule Take 1 capsule (50,000 Units total) by mouth every 7 (seven) days. 07/19/22   Joni Reining, PA-C      Allergies    Demerol [meperidine], Hydrocodone-acetaminophen, Hydrocodone-acetaminophen, and Retin-a [tretinoin]    Review of Systems   Review of Systems  Constitutional:  Negative for fever.  Respiratory:  Negative for shortness of breath.   Cardiovascular:  Negative for chest pain.  Gastrointestinal:  Negative for abdominal pain.  Genitourinary:  Negative for flank pain.  All other systems reviewed and are negative.   Physical Exam Updated  Vital Signs BP 131/78 (BP Location: Right Arm)   Pulse 88   Temp 98.7 F (37.1 C) (Oral)   Resp 18   SpO2 99%  Physical Exam Vitals and nursing note reviewed.  Constitutional:      Appearance: Normal appearance.  HENT:     Head: Normocephalic and atraumatic.     Mouth/Throat:     Mouth: Mucous membranes are moist.  Cardiovascular:     Rate and Rhythm: Normal rate.  Pulmonary:     Effort: Pulmonary effort is normal.  Abdominal:     General: Abdomen is flat.  Musculoskeletal:     Cervical back: Normal range of motion and neck supple.  Skin:    General: Skin is warm and  dry.  Neurological:     Mental Status: She is alert and oriented to person, place, and time.     ED Results / Procedures / Treatments   Labs (all labs ordered are listed, but only abnormal results are displayed) Labs Reviewed  RAPID HIV SCREEN (HIV 1/2 AB+AG)  HEPATITIS B SURFACE ANTIGEN  HCV AB W REFLEX TO QUANT PCR    EKG None  Radiology No results found.  Procedures Procedures    Medications Ordered in ED Medications - No data to display  ED Course/ Medical Decision Making/ A&P                                 Medical Decision Making Amount and/or Complexity of Data Reviewed Labs: ordered.   She is here post fluid exposure while at the dentist receiving a crown.  She reports a dental hist and cut her finger on her ground.  She was told to get tested here.  She does not have any symptoms at this time.  Did discuss with her that it was imperative that the dental assistant get tested.  I did educate her that hepatitis will likely need to be treated if positive within 24 hours, and HIV within 3 days to start PrEP.  She will call the dentist office in order to have records from the dental assistant obtained.  Did discuss with her obtaining this medication a primary care versus health department.  She is agreeable of plan and treatment at this time.    Her rapid HIV was negative, hepatitis B was added, hepatitis C was added.  She will check this on her chart at home.  Hemodynamically stable for discharge.   Portions of this note were generated with Scientist, clinical (histocompatibility and immunogenetics). Dictation errors may occur despite best attempts at proofreading.   Final Clinical Impression(s) / ED Diagnoses Final diagnoses:  Exposure to blood or body fluid    Rx / DC Orders ED Discharge Orders     None         Claude Manges, PA-C 04/12/23 1445    Rondel Baton, MD 04/13/23 1730

## 2023-04-14 LAB — HCV AB W REFLEX TO QUANT PCR: HCV Ab: NONREACTIVE

## 2023-04-14 LAB — HCV INTERPRETATION

## 2023-04-16 ENCOUNTER — Emergency Department (HOSPITAL_BASED_OUTPATIENT_CLINIC_OR_DEPARTMENT_OTHER)
Admission: EM | Admit: 2023-04-16 | Discharge: 2023-04-16 | Disposition: A | Discharge status: Home / Self Care | Payer: Self-pay | Attending: Emergency Medicine | Admitting: Emergency Medicine

## 2023-04-16 ENCOUNTER — Encounter (HOSPITAL_BASED_OUTPATIENT_CLINIC_OR_DEPARTMENT_OTHER): Payer: Self-pay | Admitting: Emergency Medicine

## 2023-04-16 ENCOUNTER — Other Ambulatory Visit: Payer: Self-pay

## 2023-04-16 DIAGNOSIS — Z23 Encounter for immunization: Secondary | ICD-10-CM | POA: Diagnosis not present

## 2023-04-16 DIAGNOSIS — Z20828 Contact with and (suspected) exposure to other viral communicable diseases: Secondary | ICD-10-CM | POA: Diagnosis not present

## 2023-04-16 DIAGNOSIS — Z7721 Contact with and (suspected) exposure to potentially hazardous body fluids: Secondary | ICD-10-CM | POA: Diagnosis not present

## 2023-04-16 MED ORDER — HEPATITIS B IMMUNE GLOBULIN IM SOLN
0.0600 mL/kg | Freq: Once | INTRAMUSCULAR | Status: AC
  Administered 2023-04-16: 3.7 mL via INTRAMUSCULAR
  Filled 2023-04-16: qty 3.7

## 2023-04-16 MED ORDER — HEPATITIS B VAC RECOMBINANT 10 MCG/0.5ML IJ SUSY
0.5000 mL | PREFILLED_SYRINGE | Freq: Once | INTRAMUSCULAR | Status: AC
  Administered 2023-04-16: 0.5 mL via INTRAMUSCULAR
  Filled 2023-04-16: qty 0.5

## 2023-04-16 NOTE — ED Notes (Signed)
 Pt states she was exposed to hep B + blood. Denies having hep B vaccine. Denies chest pain, SHOB, fevers, abd pain, NVD. Denies current symptoms. No obvious signs of acute distress. Pt A&Ox4, ambulatory.

## 2023-04-16 NOTE — ED Provider Notes (Signed)
 [place] [facility] Provider Note   CSN: [id number] Arrival date & time: 04/16/23  1551     History  Chief Complaint  Patient presents with   hepatitis B exposure     Alicia Duffy is a 48 y.o. female.  Patient presents to the emergency department today for consideration of postexposure prophylaxis for hepatitis B.  Patient was initially seen in the emergency department on 04/12/2023 after a potential blood exposure at the dentist office.  She came in for testing.  Patient's HIV rapid, hepatitis B surface antigen and hepatitis C testing were negative.  She followed up on source testing.  Patient does not have documentation of source testing and cannot get documentation from her dentist currently.  Patient discussed testing with dentist office and in person today by her report.  Patient believes that one of the tests for hepatitis B was positive.  She cannot discern whether this was a titer, antigen, or antibody test.  Patient is understandably very anxious about the situation but does not have any acute symptoms.       Home Medications Prior to Admission medications   Medication Sig Start Date End Date Taking? Authorizing Provider  Azelastine-Fluticasone 137-50 MCG/ACT SUSP Place 1 spray into both nostrils 2 (two) times daily. 12/28/21   [provider]  benzonatate (TESSALON) 200 MG capsule Take 1 capsule (200 mg total) by mouth 2 (two) times daily as needed for cough. 01/04/22   Marcina Severe, PA-C  clarithromycin (BIAXIN) 500 MG tablet Take 1 tablet (500 mg total) by mouth 2 (two) times daily. 01/04/22   Marcina Severe, PA-C  clonazePAM (KLONOPIN) 0.5 MG tablet Take 1 tablet (0.5 mg total) by mouth 2 (two) times daily as needed for anxiety. 08/12/19   Shayne Demark, MD  Cranberry-Vitamin C 15000-100 MG CAPS Take 1 Dose by mouth daily.    [provider]  famotidine (PEPCID) 20 MG tablet Take 1 tablet (20 mg total) by  mouth at bedtime. 10/14/19   Janel Medford, MD  fexofenadine-pseudoephedrine (ALLEGRA-D) 60-120 MG 12 hr tablet Take 1 tablet by mouth 2 (two) times daily. 01/04/22   Marcina Severe, PA-C  ibuprofen (ADVIL) 800 MG tablet Take 800 mg by mouth every 6 (six) hours as needed. 12/28/21   [provider]  Lactobacillus-Inulin (CULTURELLE DIGESTIVE HEALTH PO) Take 1 Dose by mouth daily.    [provider]  Multiple Vitamins-Minerals (AIRBORNE GUMMIES PO) Take 1 each by mouth.    [provider]  naproxen (NAPROSYN) 500 MG tablet Take 1 tablet (500 mg total) by mouth 2 (two) times daily with a meal. 01/04/22   Marcina Severe, PA-C  pantoprazole (PROTONIX) 40 MG tablet Take 1 tablet (40 mg total) by mouth daily. 05/24/22   Marcina Severe, PA-C  rosuvastatin (CRESTOR) 40 MG tablet Take 1 tablet (40 mg total) by mouth daily. 07/19/22   Marcina Severe, PA-C  Vitamin D, Ergocalciferol, (DRISDOL) 1.25 MG (50000 UNIT) CAPS capsule Take 1 capsule (50,000 Units total) by mouth every 7 (seven) days. 07/19/22   Marcina Severe, PA-C      Allergies    Demerol [meperidine], Hydrocodone-acetaminophen, Hydrocodone-acetaminophen, and Retin-a [tretinoin]    Review of Systems   Review of Systems  Physical Exam Updated Vital Signs BP 115/81   Pulse 97   Temp 98.3 F (36.8 C) (Oral)   Resp 18   Wt 61.2 kg   LMP 04/09/2023 (Approximate)  SpO2 100%   BMI 23.17 kg/m  Physical Exam Vitals and nursing note reviewed.  Constitutional:      Appearance: She is well-developed.  HENT:     Head: Normocephalic and atraumatic.  Eyes:     Conjunctiva/sclera: Conjunctivae normal.  Pulmonary:     Effort: No respiratory distress.  Musculoskeletal:     Cervical back: Normal range of motion and neck supple.  Skin:    General: Skin is warm and dry.  Neurological:     Mental Status: She is alert.  Psychiatric:        Mood and Affect: Mood is anxious.     ED Results / Procedures /  Treatments   Labs (all labs ordered are listed, but only abnormal results are displayed) Labs Reviewed - No data to display  EKG None  Radiology No results found.  Procedures Procedures    Medications Ordered in ED Medications  hepatitis B recombinant vaccine for neonates/pediatrics (ENGERIX-B) injection 0.5 mL (has no administration in time range)  hepatitis B immune globulin injection 3.7 mL (has no administration in time range)    ED Course/ Medical Decision Making/ A&P    Patient seen and examined. History obtained directly from patient.  Reviewed recent lab workup and reviewed recent ED notes.  Labs/EKG: None ordered  Imaging: None ordered  Medications/Fluids: Ordered: Hepatitis B vaccine, hepatitis B Ig.   Most recent vital signs reviewed and are as follows: BP 115/81   Pulse 97   Temp 98.3 F (36.8 C) (Oral)   Resp 18   Wt 61.2 kg   LMP 04/09/2023 (Approximate)   SpO2 100%   BMI 23.17 kg/m   Initial impression: This is a difficult situation as we cannot definitively know source status in regards to hepatitis B.  Patient notes concern that one of her test were positive however it is unclear what sort of testing was positive.  She is unable to get additional information from the dentist office at this time.  It is not likely that she will get additional information in a timely manner.  Given that postexposure prophylaxis should be given in the shortest time possible, feel that patient should begin hepatitis B vaccine and given that she is not immunized for hepatitis B, also receive hepatitis B Ig.  I discussed this with ED pharmacist at [facility].  We are in agreement.  Patient agrees to prophylactic treatment.  We have hepatitis B vaccine available here.  Hepatitis B Ig available at [facility] will be couriered here for administration.  Patient is willing to await this medication.  In regards to post testing and additional vaccine, patient will be referred to  infectious disease for further recommendations and appropriate treatment and surveillance.  6:51 PM Signout to Prosperi PA-C at shift change.   Pending administration of Hep B IG.                                 Medical Decision Making Risk Prescription drug management.   Patient with presumed exposure to hepatitis B.  Discussion and treatment as above.  Patient is otherwise asymptomatic.        Final Clinical Impression(s) / ED Diagnoses Final diagnoses:  Need for post exposure prophylaxis for hepatitis B    Rx / DC Orders ED Discharge Orders     None         Renne Crigler, PA-C 04/16/23 2956  Merdis Stalling, MD 04/16/23 [phone]

## 2023-04-16 NOTE — Discharge Instructions (Signed)
 Please read and follow all provided instructions.  Your diagnoses today include:  1. Need for post exposure prophylaxis for hepatitis B     Tests performed today include: Vital signs. See below for your results today.   Medications prescribed:  None  Take any prescribed medications only as directed.  Home care instructions:  Follow any educational materials contained in this packet.  Follow-up instructions: Please follow-up with the Infectious disease clinic, call for an appointment. They should help guide you on additional need for testing and treatment.   Return instructions:  Please return to the Emergency Department if you experience worsening symptoms.  Return if you develop abdominal pain, fever, yellowing of your skin, or other concerns. Please return if you have any other emergent concerns.  Additional Information:  Your vital signs today were: BP 115/81   Pulse 97   Temp 98.3 F (36.8 C) (Oral)   Resp 18   Wt 61.2 kg   LMP 04/09/2023 (Approximate)   SpO2 100%   BMI 23.17 kg/m  If your blood pressure (BP) was elevated above 135/85 this visit, please have this repeated by your doctor within one month. --------------

## 2023-04-16 NOTE — ED Triage Notes (Signed)
 Pt was seen here and tested for possible exposure to hepatitis B , C , and HIV .  Pt reports her tests results are negative yet the person she was exposed to is positive for Hepatitis B . Pt is here requesting treatment for hepatitis B . Pt is extremely stressed and tearful in triage .

## 2023-04-17 ENCOUNTER — Telehealth: Payer: Self-pay

## 2023-04-17 DIAGNOSIS — Z7721 Contact with and (suspected) exposure to potentially hazardous body fluids: Secondary | ICD-10-CM

## 2023-04-17 NOTE — Telephone Encounter (Signed)
 Blood borne pathogen exposure 04/12/2023 at her dentist office.  Went to [facility] ED at [facility] for evaluation & labs on 04/12/2023.  Went to [facility] ED [facility] for Post Exposure Prophylaxis for Hepatitis B on 04/16/2023.  Advised to follow-up with [facility] for Infectious Disease in 1 week.  Called [facility] today & spoke with Katha Palau, RN.  Alicia Duffy is requesting [facility] for her follow-up.  Referral for Dr. Ashley Chaplin of [facility] put in Epic. Referral & notes faxed to [phone]

## 2023-05-17 ENCOUNTER — Telehealth (HOSPITAL_BASED_OUTPATIENT_CLINIC_OR_DEPARTMENT_OTHER): Payer: Self-pay

## 2023-05-21 DIAGNOSIS — Z205 Contact with and (suspected) exposure to viral hepatitis: Secondary | ICD-10-CM | POA: Diagnosis not present

## 2023-05-21 DIAGNOSIS — Z1331 Encounter for screening for depression: Secondary | ICD-10-CM | POA: Diagnosis not present

## 2023-05-21 DIAGNOSIS — Z23 Encounter for immunization: Secondary | ICD-10-CM | POA: Diagnosis not present

## 2023-06-06 ENCOUNTER — Other Ambulatory Visit: Payer: Self-pay | Admitting: Physician Assistant

## 2023-06-06 DIAGNOSIS — K219 Gastro-esophageal reflux disease without esophagitis: Secondary | ICD-10-CM

## 2023-06-07 ENCOUNTER — Other Ambulatory Visit: Payer: Self-pay

## 2023-06-07 ENCOUNTER — Telehealth: Payer: Self-pay

## 2023-06-07 DIAGNOSIS — K219 Gastro-esophageal reflux disease without esophagitis: Secondary | ICD-10-CM | POA: Insufficient documentation

## 2023-06-07 NOTE — Telephone Encounter (Signed)
 Received notification from patient's pharmacy that a PA was needed for Pantaprazole 40 mg & had already been iniated through [employer].  Completed the PA through [employer].  Received notification from [employer] [employer] that PA for Pantoprazole  40 mg is approved from  06/07/2023 to 06/06/2026.     Date: 06/07/2023 Alicia Duffy [address] [place], [place] [address] RE: Alicia Duffy approved your request for coverage of Pantoprazole  Sodium 40MG  OR TBEC. Dear Alicia BergamoArnaldo Duffy pleased to let you know that we've approved your or your doctor's request for coverage for Pantoprazole  Sodium 40MG  OR TBEC. You can now fill your prescription, and it will be covered according to your plan. As long as you remain covered by your prescription drug plan and there are no changes to your plan benefits, this request is approved from 06/07/2023 to 06/06/2026. When this approval expires, please speak to your doctor about your treatment. Sincerely, [facility] [employer] cc: Dr. Caryl Duffy If you have questions, we want to help. Call the number on your prescription ID card or in your plan materials to speak with a representative.

## 2023-07-12 ENCOUNTER — Other Ambulatory Visit: Payer: Self-pay | Admitting: Physician Assistant

## 2023-07-12 DIAGNOSIS — Z1231 Encounter for screening mammogram for malignant neoplasm of breast: Secondary | ICD-10-CM

## 2023-07-27 ENCOUNTER — Ambulatory Visit
Admission: RE | Admit: 2023-07-27 | Discharge: 2023-07-27 | Disposition: A | Discharge status: Home / Self Care | Source: Ambulatory Visit | Attending: Physician Assistant | Admitting: Physician Assistant

## 2023-07-27 ENCOUNTER — Ambulatory Visit: Payer: Self-pay

## 2023-07-27 DIAGNOSIS — Z0000 Encounter for general adult medical examination without abnormal findings: Secondary | ICD-10-CM

## 2023-07-27 DIAGNOSIS — Z1231 Encounter for screening mammogram for malignant neoplasm of breast: Secondary | ICD-10-CM | POA: Diagnosis not present

## 2023-07-27 LAB — POCT URINALYSIS DIPSTICK
Bilirubin, UA: NEGATIVE
Glucose, UA: NEGATIVE
Ketones, UA: NEGATIVE
Leukocytes, UA: NEGATIVE
Nitrite, UA: NEGATIVE
Protein, UA: NEGATIVE
Spec Grav, UA: 1.015 (ref 1.010–1.025)
Urobilinogen, UA: 0.2 U/dL
pH, UA: 6 (ref 5.0–8.0)

## 2023-07-28 LAB — CMP12+LP+TP+TSH+6AC+CBC/D/PLT
ALT: 18 IU/L (ref 0–32)
AST: 21 IU/L (ref 0–40)
Albumin: 4.6 g/dL (ref 3.9–4.9)
Alkaline Phosphatase: 62 IU/L (ref 44–121)
BUN/Creatinine Ratio: 23 (ref 9–23)
BUN: 15 mg/dL (ref 6–24)
Basophils Absolute: 0.1 x10E3/uL (ref 0.0–0.2)
Basos: 1 %
Bilirubin Total: 0.7 mg/dL (ref 0.0–1.2)
Calcium: 9.6 mg/dL (ref 8.7–10.2)
Chloride: 104 mmol/L (ref 96–106)
Chol/HDL Ratio: 2.2 ratio (ref 0.0–4.4)
Cholesterol, Total: 172 mg/dL (ref 100–199)
Creatinine, Ser: 0.66 mg/dL (ref 0.57–1.00)
EOS (ABSOLUTE): 0.1 x10E3/uL (ref 0.0–0.4)
Eos: 2 %
Estimated CHD Risk: <0.5 times avg. (ref 0.0–1.0)
Free Thyroxine Index: 2.3 (ref 1.2–4.9)
GGT: 13 IU/L (ref 0–60)
Globulin, Total: 2.2 g/dL (ref 1.5–4.5)
Glucose: 95 mg/dL (ref 70–99)
HDL: 79 mg/dL (ref >39)
Hematocrit: 41.6 % (ref 34.0–46.6)
Hemoglobin: 13.5 g/dL (ref 11.1–15.9)
Immature Grans (Abs): 0 x10E3/uL (ref 0.0–0.1)
Immature Granulocytes: 0 %
Iron: 189 ug/dL — ABNORMAL HIGH (ref 27–159)
LDH: 167 IU/L (ref 119–226)
LDL Chol Calc (NIH): 83 mg/dL (ref 0–99)
Lymphocytes Absolute: 2 x10E3/uL (ref 0.7–3.1)
Lymphs: 43 %
MCH: 31.9 pg (ref 26.6–33.0)
MCHC: 32.5 g/dL (ref 31.5–35.7)
MCV: 98 fL — ABNORMAL HIGH (ref 79–97)
Monocytes Absolute: 0.5 x10E3/uL (ref 0.1–0.9)
Monocytes: 10 %
Neutrophils Absolute: 2 x10E3/uL (ref 1.4–7.0)
Neutrophils: 44 %
Phosphorus: 3.8 mg/dL (ref 3.0–4.3)
Platelets: 263 x10E3/uL (ref 150–450)
Potassium: 5 mmol/L (ref 3.5–5.2)
RBC: 4.23 x10E6/uL (ref 3.77–5.28)
RDW: 11.7 % (ref 11.7–15.4)
Sodium: 140 mmol/L (ref 134–144)
T3 Uptake Ratio: 27 % (ref 24–39)
T4, Total: 8.7 ug/dL (ref 4.5–12.0)
TSH: 2.8 u[IU]/mL (ref 0.450–4.500)
Total Protein: 6.8 g/dL (ref 6.0–8.5)
Triglycerides: 47 mg/dL (ref 0–149)
Uric Acid: 3.3 mg/dL (ref 2.6–6.2)
VLDL Cholesterol Cal: 10 mg/dL (ref 5–40)
WBC: 4.6 x10E3/uL (ref 3.4–10.8)
eGFR: 108 mL/min/1.73 (ref >59)

## 2023-08-02 ENCOUNTER — Ambulatory Visit: Payer: Self-pay | Admitting: Physician Assistant

## 2023-08-02 ENCOUNTER — Encounter: Payer: Self-pay | Admitting: Physician Assistant

## 2023-08-02 VITALS — BP 116/66 | HR 80 | Resp 16 | Ht 63.0 in | Wt 140.0 lb

## 2023-08-02 DIAGNOSIS — Z0000 Encounter for general adult medical examination without abnormal findings: Secondary | ICD-10-CM

## 2023-08-02 DIAGNOSIS — Z8249 Family history of ischemic heart disease and other diseases of the circulatory system: Secondary | ICD-10-CM

## 2023-08-02 NOTE — Progress Notes (Signed)
 [facility] [place] [facility]  ____________________________________________   None    (approximate)  I have reviewed the triage vital signs and the nursing notes.   HISTORY  Chief Complaint Annual Exam   HPI Alicia Duffy is a 48 y.o. female patient presents for annual physical exam.  Patient voices concern due to strong family history mortality due to cardiac events.  States 1 brother died at the age of 35 and second brother died at age 60 both from myocardial infarction.  Brothers were preceded by the father who died of MI.  Patient also states she has developed muscle cramps from taking Crestor .  Patient states she discontinued medication 4 days ago and noticed much improvement with decreased muscle cramps.  Past Medical History:  Diagnosis Date   Allergy    Anemia    d/t heavy menses   Contraception    husband vasectomy   Endometriosis    dx 2007 at time of surhery   GERD (gastroesophageal reflux disease)    Panic attacks    Vaginal septum    Niels Brasil, OB/GYN    Patient Active Problem List   Diagnosis Date Noted   GERD (gastroesophageal reflux disease) 06/07/2023   Atypical cervical glandular cells 09/30/2019   Vaginal septum 09/29/2019   Dyspnea on exertion 08/06/2017   Family history of MI (myocardial infarction) 08/01/2017   Chest pain 07/13/2014   Generalized anxiety disorder 03/05/2014   Endometriosis 03/05/2014    Past Surgical History:  Procedure Laterality Date   CESAREAN SECTION     CHOLECYSTECTOMY     OVARIAN CYST REMOVAL Left 2007    Prior to Admission medications   Medication Sig Start Date End Date Taking? Authorizing Provider  Azelastine-Fluticasone 137-50 MCG/ACT SUSP Place 1 spray into both nostrils 2 (two) times daily. 12/28/21  Yes [provider]  clonazePAM  (KLONOPIN ) 0.5 MG tablet Take 1 tablet (0.5 mg total) by mouth 2 (two) times daily as needed for anxiety. 08/12/19  Yes Trudy Dorn BRAVO, MD   Coenzyme Q10 (COQ10 PO) Take by mouth.   Yes [provider]  Cranberry-Vitamin C 15000-100 MG CAPS Take 1 Dose by mouth daily.   Yes [provider]  ibuprofen (ADVIL) 800 MG tablet Take 800 mg by mouth every 6 (six) hours as needed. 12/28/21  Yes [provider]  Lactobacillus-Inulin (CULTURELLE DIGESTIVE HEALTH PO) Take 1 Dose by mouth daily.   Yes [provider]  Multiple Vitamins-Minerals (AIRBORNE GUMMIES PO) Take 1 each by mouth.   Yes [provider]  pantoprazole  (PROTONIX ) 40 MG tablet Take 1 tablet by mouth once daily 06/07/23  Yes Claudene Tanda POUR, PA-C  Vitamin D , Ergocalciferol , (DRISDOL ) 1.25 MG (50000 UNIT) CAPS capsule Take 1 capsule (50,000 Units total) by mouth every 7 (seven) days. 07/19/22  Yes Claudene Tanda POUR, PA-C  benzonatate  (TESSALON ) 200 MG capsule Take 1 capsule (200 mg total) by mouth 2 (two) times daily as needed for cough. 01/04/22   Claudene Tanda POUR, PA-C  clarithromycin  (BIAXIN ) 500 MG tablet Take 1 tablet (500 mg total) by mouth 2 (two) times daily. 01/04/22   Claudene Tanda POUR, PA-C  famotidine  (PEPCID ) 20 MG tablet Take 1 tablet (20 mg total) by mouth at bedtime. 10/14/19   Teressa Toribio SQUIBB, MD  ferrous sulfate 324 (65 Fe) MG TBEC Take 324 mg by mouth. Patient not taking: Reported on 08/02/2023    [provider]  fexofenadine -pseudoephedrine (ALLEGRA-D) 60-120 MG 12 hr tablet Take 1  tablet by mouth 2 (two) times daily. 01/04/22   Claudene Tanda POUR, PA-C  naproxen  (NAPROSYN ) 500 MG tablet Take 1 tablet (500 mg total) by mouth 2 (two) times daily with a meal. 01/04/22   Claudene Tanda POUR, PA-C  rosuvastatin  (CRESTOR ) 40 MG tablet Take 1 tablet (40 mg total) by mouth daily. Patient not taking: Reported on 08/02/2023 07/19/22   Claudene Tanda POUR, PA-C    Allergies Clarithromycin , Demerol [meperidine], Hydrocodone-acetaminophen, Hydrocodone-acetaminophen, and Retin-a [tretinoin]  Family History  Problem Relation Age of Onset    Cancer Mother    Heart disease Father    Depression Father    Heart attack Father    Heart attack Brother    Heart attack Paternal Grandfather    Heart attack Brother    Breast cancer Neg Hx    Colon cancer Neg Hx    Rectal cancer Neg Hx    Stomach cancer Neg Hx    Esophageal cancer Neg Hx     Social History Social History   Tobacco Use   Smoking status: Never   Smokeless tobacco: Never  Substance Use Topics   Alcohol use: No   Drug use: No    Review of Systems  Constitutional: No fever/chills Eyes: No visual changes. ENT: No sore throat. Cardiovascular: Denies chest pain. Respiratory: Denies shortness of breath. Gastrointestinal: No abdominal pain.  No nausea, no vomiting.  No diarrhea.  No constipation.  GERD Genitourinary: Negative for dysuria. Musculoskeletal: Negative for back pain. Skin: Negative for rash. Neurological: Negative for headaches, focal weakness or numbness. Psychiatric: Anxiety Allergic/Immunilogical: Clindamycin, Demerol, codeine, and Retin-A. ____________________________________________   PHYSICAL EXAM:  VITAL SIGNS: BP 116/66  Cuff Size Normal  Pulse Rate 80  Weight 140 lb (63.5 kg)  Height 5' 3 (1.6 m)  Resp 16  SpO2 99 %   BMI: 24.80 kg/m2  BSA: 1.68 m2   Constitutional: Alert and oriented. Well appearing and in no acute distress. Eyes: Conjunctivae are normal. PERRL. EOMI. Head: Atraumatic. Nose: No congestion/rhinnorhea. Mouth/Throat: Mucous membranes are moist.  Oropharynx non-erythematous. Neck: No stridor. No cervical spine tenderness to palpation. Hematological/Lymphatic/Immunilogical: No cervical lymphadenopathy. Cardiovascular: Normal rate, regular rhythm. Grossly normal heart sounds.  Good peripheral circulation. Respiratory: Normal respiratory effort.  No retractions. Lungs CTAB. Gastrointestinal: Soft and nontender. No distention. No abdominal bruits. No CVA tenderness. Genitourinary: Deferred Musculoskeletal: No  lower extremity tenderness nor edema.  No joint effusions. Neurologic:  Normal speech and language. No gross focal neurologic deficits are appreciated. No gait instability. Skin:  Skin is warm, dry and intact. No rash noted. Psychiatric: Mood and affect are normal. Speech and behavior are normal.  ____________________________________________   LABS            Component Ref Range & Units (hover) 6 d ago 9 mo ago 1 yr ago 2 yr ago 3 yr ago 8 yr ago 9 yr ago  Color, UA yellow pale yellow dark yellow honey    Clarity, UA clear clear clear cloudy clear    Glucose, UA Negative Negative Negative Negative Negative    Bilirubin, UA neg neg neg negative negative    Ketones, UA neg neg neg negative positive CM    Spec Grav, UA 1.015 <=1.005 Abnormal  1.015 1.015 1.015    Blood, UA trace-+ neg neg positive CM negative    pH, UA 6.0 6.5 6.0 8.0 7.0    Protein, UA Negative Negative Positive Abnormal  CM Positive Abnormal  CM Positive Abnormal  CM  Urobilinogen, UA 0.2 0.2 0.2 0.2 0.2 0.2 R 0.2 R  Nitrite, UA neg neg neg negative negative    Leukocytes, UA Negative Negative Negative Negative Negative NEGATIVE NEGATIVE  Appearance     dark Turbid Abnormal  R CLEAR R  Odor                               Component Ref Range & Units (hover) 6 d ago (07/27/23) 9 mo ago (10/20/22) 9 mo ago (10/20/22) 1 yr ago (07/14/22) 2 yr ago (07/13/21) 3 yr ago (07/14/20) 3 yr ago (07/14/20)  Glucose 95   84 90  88 R  Uric Acid 3.3   3.1 CM 3.0 CM  2.9 CM  Comment:            Therapeutic target for gout patients: <6.0  BUN 15   12 12  12   Creatinine, Ser 0.66   0.66 0.70  0.57  eGFR 108   109 108  114  BUN/Creatinine Ratio 23   18 17  21   Sodium 140   138 140  140  Potassium 5.0   4.0 4.1  4.1  Chloride 104   100 103  103  Calcium  9.6   9.6 9.4  9.2  Phosphorus 3.8   3.8 3.8  3.3  Total Protein 6.8 7.2  7.3 7.3  7.0  Albumin 4.6 4.5  4.4 4.6 CM  4.7 R  Globulin, Total 2.2   2.9 2.7  2.3  Bilirubin  Total 0.7 0.6  0.3 0.3  0.4  Alkaline Phosphatase 62 68  77 56  57  LDH 167   149 162  153  AST 21 19  17 16  16   ALT 18 17  24 10  13   GGT 13   32 10  12  Iron 189 High    101 81  123  Cholesterol, Total 172  163 302 High  262 High   231 High   Triglycerides 47  55 169 High  52  44  HDL 79  88 73 78  73  VLDL Cholesterol Cal 10  11 31 8  7   LDL Chol Calc (NIH) 83  64 198 High  176 High   151 High   Chol/HDL Ratio 2.2   4.1 CM 3.4 CM  3.2 CM  Comment:                                   T. Chol/HDL Ratio                                             Men  Women                               1/2 Avg.Risk  3.4    3.3                                   Avg.Risk  5.0    4.4  2X Avg.Risk  9.6    7.1                                3X Avg.Risk 23.4   11.0  Estimated CHD Risk  < 0.5   0.9 CM  < 0.5 CM   < 0.5 CM  Comment: The CHD Risk is based on the T. Chol/HDL ratio. Other factors affect CHD Risk such as hypertension, smoking, diabetes, severe obesity, and family history of premature CHD.  TSH 2.800   4.440 2.100  1.770  T4, Total 8.7   7.4 7.4  7.5  T3 Uptake Ratio 27   26 21  Low   24  Free Thyroxine Index 2.3   1.9 1.6  1.8  WBC 4.6   7.8 5.2 4.7 CANCELED R, CM  RBC 4.23   4.39 4.14 4.10 CANCELED R, CM  Hemoglobin 13.5   13.4 12.9 12.9 CANCELED R, CM  Hematocrit 41.6   41.1 38.1 39.6 CANCELED R, CM  MCV 98 High    94 92 97   MCH 31.9   30.5 31.2 31.5   MCHC 32.5   32.6 33.9 32.6   RDW 11.7   12.7 12.9 12.6   Platelets 263   380 320 321 CANCELED R, CM  Neutrophils 44   50 57 59 CANCELED R, CM  Lymphs 43   39 33 31 CANCELED R, CM  Monocytes 10   9 8 8  CANCELED R, CM  Eos 2   1 1 1  CANCELED R, CM  Basos 1   1 1 1    Neutrophils Absolute 2.0   3.9 3.0 2.8   Lymphocytes Absolute 2.0   3.0 1.7 1.4 CANCELED R, CM  Monocytes Absolute 0.5   0.7 0.4 0.4   EOS (ABSOLUTE) 0.1   0.1 0.0 0.0 CANCELED R, CM  Basophils Absolute 0.1   0.1 0.1 0.0 CANCELED R, CM   Immature Granulocytes 0   0 0 0   Immature Grans (Abs) 0.0   0.0 0.0 0.0              ____________________________________________  EKG  Sinus rhythm at 65 bpm ____________________________________________    ____________________________________________   INITIAL IMPRESSION / ASSESSMENT AND PLAN  As part of my medical decision making, I reviewed the following data within the electronic MEDICAL RECORD NUMBER      No acute findings on physical exam, EKG, or labs.  Due to patient concern and strong family history and mortality with myocardial infarctions will consult to cardiology for evaluation.  Patient also will follow-up in his clinic 3 months for fasting lipid profile.        ____________________________________________   FINAL CLINICAL IMPRESSION Well exam   ED Discharge Orders     None        Note:  This document was prepared using Dragon voice recognition software and may include unintentional dictation errors.

## 2023-08-02 NOTE — Progress Notes (Signed)
 Pt presents today to complete physical, Pt states she stopped her cholesterol medication Sunday due to extreme muslce aches. Pt would like to address some other things as well. Alicia Duffy

## 2023-08-20 DIAGNOSIS — Z205 Contact with and (suspected) exposure to viral hepatitis: Secondary | ICD-10-CM | POA: Diagnosis not present

## 2023-10-03 ENCOUNTER — Other Ambulatory Visit: Payer: Self-pay

## 2023-10-03 DIAGNOSIS — F410 Panic disorder [episodic paroxysmal anxiety] without agoraphobia: Secondary | ICD-10-CM | POA: Insufficient documentation

## 2023-10-03 DIAGNOSIS — T7840XA Allergy, unspecified, initial encounter: Secondary | ICD-10-CM | POA: Insufficient documentation

## 2023-10-03 DIAGNOSIS — D649 Anemia, unspecified: Secondary | ICD-10-CM | POA: Insufficient documentation

## 2023-10-04 ENCOUNTER — Ambulatory Visit: Admitting: Cardiology

## 2023-10-04 ENCOUNTER — Ambulatory Visit: Attending: Cardiology | Admitting: Cardiology

## 2023-10-04 VITALS — BP 108/62 | HR 73 | Ht 64.0 in | Wt 142.4 lb

## 2023-10-04 DIAGNOSIS — R0789 Other chest pain: Secondary | ICD-10-CM | POA: Diagnosis not present

## 2023-10-04 DIAGNOSIS — E785 Hyperlipidemia, unspecified: Secondary | ICD-10-CM | POA: Diagnosis not present

## 2023-10-04 DIAGNOSIS — R0609 Other forms of dyspnea: Secondary | ICD-10-CM | POA: Diagnosis not present

## 2023-10-04 DIAGNOSIS — R072 Precordial pain: Secondary | ICD-10-CM | POA: Diagnosis not present

## 2023-10-04 DIAGNOSIS — Z8249 Family history of ischemic heart disease and other diseases of the circulatory system: Secondary | ICD-10-CM

## 2023-10-04 HISTORY — DX: Hyperlipidemia, unspecified: E78.5

## 2023-10-04 MED ORDER — METOPROLOL TARTRATE 100 MG PO TABS: ORAL_TABLET | ORAL | 0 refills | Status: DC

## 2023-10-04 NOTE — Progress Notes (Signed)
 Cardiology Consultation:    Date:  10/04/2023   ID:  Alicia Duffy, DOB 09-01-1975, MRN [id number]  PCP:  Claudene Tanda POUR, PA-C  Cardiologist:  Lamar Fitch, MD   Referring MD: Claudene Tanda POUR, PA-C   No chief complaint on file. I would like to be established as a patient  History of Present Illness:    Alicia Duffy is a 48 y.o. female who is being seen today for the evaluation of dyspnea exertion, atypical chest pain at the request of Smith, Ronald K, PA-C.  Past medical history significant for anxiety, panic attacks, dyslipidemia, she was started with rosuvastatin  but started complaining of having memory issue and she is stopped that medication now she is on Lovaza, she does have a horrible family history of premature coronary artery disease.  Multiple family members with premature coronary artery disease with premature cardiac death.  She is obviously very worried about it.  Recently started exercising on the regular basis she is trying to walk she gets short of breath quite easily and sometimes get chest pain.  It is difficult for her to distinguish if chest pain is related to anxiety/panic attack or heart.  She does not smoke trying to stick with good diet and trying to avoiding red meat.  And recently about 3 weeks ago started exercising on a regular basis.  No dizziness no passing out no swelling of lower extremities.  Sleep well.  Her husband is in her room and participate in decision-making.  Past Medical History:  Diagnosis Date   Allergy    Anemia    d/t heavy menses   Atypical cervical glandular cells 09/30/2019   Formatting of this note might be different from the original.  09/2019 atypical endocervical cells, AGUS, neg HRHPV. Rec colpo and EMBX     Chest pain 07/13/2014   Dyspnea on exertion 08/06/2017   Endometriosis    dx 2007 at time of surhery   Family history of MI (myocardial infarction) 08/01/2017   Generalized anxiety disorder 03/05/2014   GERD  (gastroesophageal reflux disease)    Panic attacks    Vaginal septum    Niels Brasil, OB/GYN    Past Surgical History:  Procedure Laterality Date   CESAREAN SECTION     CHOLECYSTECTOMY     OVARIAN CYST REMOVAL Left 2007    Current Medications: Current Meds  Medication Sig   Azelastine-Fluticasone 137-50 MCG/ACT SUSP Place 1 spray into both nostrils 2 (two) times daily.   clonazePAM  (KLONOPIN ) 0.5 MG tablet Take 1 tablet (0.5 mg total) by mouth 2 (two) times daily as needed for anxiety.   Coenzyme Q10 (COQ10 PO) Take by mouth.   Cranberry-Vitamin C 15000-100 MG CAPS Take 1 Dose by mouth daily.   Cyanocobalamin (B-12) 5000 MCG CAPS Take 1 tablet by mouth daily.   ibuprofen (ADVIL) 800 MG tablet Take 800 mg by mouth every 6 (six) hours as needed.   Lactobacillus-Inulin (CULTURELLE DIGESTIVE HEALTH PO) Take 1 Dose by mouth daily.   Multiple Vitamins-Minerals (AIRBORNE GUMMIES PO) Take 1 each by mouth.   omega-3 acid ethyl esters (LOVAZA) 1 g capsule Take 1 capsule by mouth 2 (two) times daily.   pantoprazole  (PROTONIX ) 40 MG tablet Take 1 tablet by mouth once daily   Vitamin D , Ergocalciferol , (DRISDOL ) 1.25 MG (50000 UNIT) CAPS capsule Take 1 capsule (50,000 Units total) by mouth every 7 (seven) days.     Allergies:   Clarithromycin , Demerol [meperidine], Hydrocodone-acetaminophen, Hydrocodone-acetaminophen, and Retin-a [tretinoin]  Social History   Socioeconomic History   Marital status: Married    Spouse name: Not on file   Number of children: 2   Years of education: Not on file   Highest education level: Not on file  Occupational History   Occupation: works part time   Tobacco Use   Smoking status: Never   Smokeless tobacco: Never  Vaping Use   Vaping status: Not on file  Substance and Sexual Activity   Alcohol use: No   Drug use: No   Sexual activity: Yes  Other Topics Concern   Not on file  Social History Narrative   Not on file   Social Drivers of Health    Financial Resource Strain: Low Risk  (05/21/2023)   Received from [facility]   Overall Financial Resource Strain (CARDIA)    Difficulty of Paying Living Expenses: Not hard at all  Food Insecurity: No Food Insecurity (05/21/2023)   Received from [facility]   Hunger Vital Sign    Within the past 12 months, you worried that your food would run out before you got the money to buy more.: Never true    Within the past 12 months, the food you bought just didn't last and you didn't have money to get more.: Never true  Transportation Needs: No Transportation Needs (05/21/2023)   Received from [facility]    Lack of Transportation (Medical): No    Lack of Transportation (Non-Medical): No  Physical Activity: Not on file  Stress: Not on file  Social Connections: Not on file     Family History: The patient's family history includes Cancer in her mother; Depression in her father; Heart attack in her brother, brother, father, and paternal grandfather; Heart disease in her father. There is no history of Breast cancer, Colon cancer, Rectal cancer, Stomach cancer, or Esophageal cancer. ROS:   Please see the history of present illness.    All 14 point review of systems negative except as described per history of present illness.  EKGs/Labs/Other Studies Reviewed:    The following studies were reviewed today:   EKG:  EKG Interpretation Date/Time:  Thursday October 04 2023 08:33:33 EDT Ventricular Rate:  73 PR Interval:  156 QRS Duration:  74 QT Interval:  372 QTC Calculation: 409 R Axis:   76  Text Interpretation: Normal sinus rhythm Normal ECG When compared with ECG of 11-Mar-2015 07:37, PREVIOUS ECG IS PRESENT Confirmed by Bernie Charleston [phone]) on 10/04/2023 8:40:30 AM    Recent Labs: 07/27/2023: ALT 18; BUN 15; Creatinine, Ser 0.66; Hemoglobin 13.5; Platelets 263; Potassium 5.0; Sodium 140; TSH 2.800  Recent Lipid Panel    Component Value Date/Time    CHOL 172 07/27/2023 0940   TRIG 47 07/27/2023 0940   HDL 79 07/27/2023 0940   CHOLHDL 2.2 07/27/2023 0940   LDLCALC 83 07/27/2023 0940    Physical Exam:    VS:  BP 108/62   Pulse 73   Ht 5' 4 (1.626 m)   Wt 142 lb 6.4 oz (64.6 kg)   SpO2 98%   BMI 24.44 kg/m     Wt Readings from Last 3 Encounters:  10/04/23 142 lb 6.4 oz (64.6 kg)  08/02/23 140 lb (63.5 kg)  04/16/23 135 lb (61.2 kg)     GEN:  Well nourished, well developed in no acute distress HEENT: Normal NECK: No JVD; No carotid bruits LYMPHATICS: No lymphadenopathy CARDIAC: RRR, no murmurs, no rubs, no gallops RESPIRATORY:  Clear to auscultation without rales, wheezing or rhonchi  ABDOMEN: Soft, non-tender, non-distended MUSCULOSKELETAL:  No edema; No deformity  SKIN: Warm and dry NEUROLOGIC:  Alert and oriented x 3 PSYCHIATRIC:  Normal affect   ASSESSMENT:    1. Family history of cardiac disorder   2. Atypical chest pain   3. Dyspnea on exertion   4. Family history of coronary artery disease   5. Dyslipidemia    PLAN:    In order of problems listed above:  Family history of premature coronary artery disease quite scary a lot of family members having problem before age of 44 including sudden cardiac deaths.  I think the issues cholesterol, I do have her fasting lipid profile when she was taking rosuvastatin  LDL 83 HDL 79.  She started having some symptoms which are concerning as well.  I think the best course of action will be to do echocardiogram to assess left ventricle ejection fraction try to answer the question why she is short of breath, I will also schedule her to have coronary CT angio which will help me determine where we stand with atherosclerosis.  In terms of cholesterol reducing medication.  I will recheck her fasting lipid profile, LP(a) and APO B and based on that we decide about management.  I suspect she will require some medications.  I think PCSK9 agent will be a good choice for her.  She does  have 2 children both of her children have high cholesterol both of them are taking cholesterol medication already. Dyspnea exertion echocardiogram will be done. Dyslipidemia discussion as above. We did talk about healthy lifestyle I did recommend to exercise at least 5 times a week 30 minutes moderate next exercise, I did also recommend Mediterranean diet and discussed basic of it.   Medication Adjustments/Labs and Tests Ordered: Current medicines are reviewed at length with the patient today.  Concerns regarding medicines are outlined above.  Orders Placed This Encounter  Procedures   EKG 12-Lead   No orders of the defined types were placed in this encounter.   Signed, Lamar DOROTHA Fitch, MD, [facility]. 10/04/2023 9:02 AM    [facility]

## 2023-10-04 NOTE — Patient Instructions (Addendum)
 Medication Instructions:   TAKE: Metoprolol 100mg  1 tablet 2 hours prior to CT scan   Lab Work: Lipid, AST, ALT, Lpa, APO-B- today If you have labs (blood work) drawn today and your tests are completely normal, you will receive your results only by: MyChart Message (if you have MyChart) OR A paper copy in the mail If you have any lab test that is abnormal or we need to change your treatment, we will call you to review the results.   Testing/Procedures:  Your physician has requested that you have an echocardiogram. Echocardiography is a painless test that uses sound waves to create images of your heart. It provides your doctor with information about the size and shape of your heart and how well your heart's chambers and valves are working. This procedure takes approximately one hour. There are no restrictions for this procedure. Please do NOT wear cologne, perfume, aftershave, or lotions (deodorant is allowed). Please arrive 15 minutes prior to your appointment time.  Please note: We ask at that you not bring children with you during ultrasound (echo/ vascular) testing. Due to room size and safety concerns, children are not allowed in the ultrasound rooms during exams. Our front office staff cannot provide observation of children in our lobby area while testing is being conducted. An adult accompanying a patient to their appointment will only be allowed in the ultrasound room at the discretion of the ultrasound technician under special circumstances. We apologize for any inconvenience.   Your cardiac CT will be scheduled at one of the below locations:   [facility] [address] [place], [place] [address]  Please follow these instructions carefully (unless otherwise directed):    On the Night Before the Test: Be sure to Drink plenty of water. Do not consume any caffeinated/decaffeinated beverages or chocolate 12 hours prior to your test. Do not take any antihistamines 12  hours prior to your test.   On the Day of the Test: Drink plenty of water until 1 hour prior to the test. Do not eat any food 4 hours prior to the test. You may take your regular medications prior to the test.  Take metoprolol (Lopressor) two hours prior to test. FEMALES- please wear underwire-free bra if available, avoid dresses & tight clothing       After the Test: Drink plenty of water. After receiving IV contrast, you may experience a mild flushed feeling. This is normal. On occasion, you may experience a mild rash up to 24 hours after the test. This is not dangerous. If this occurs, you can take Benadryl 25 mg and increase your fluid intake. If you experience trouble breathing, this can be serious. If it is severe call 911 IMMEDIATELY. If it is mild, please call our office. If you take any of these medications: Glipizide/Metformin, Avandament, Glucavance, please do not take 48 hours after completing test unless otherwise instructed.  We will call to schedule your test 2-4 weeks out understanding that some insurance companies will need an authorization prior to the service being performed.   For non-scheduling related questions, please contact the cardiac imaging nurse navigator should you have any questions/concerns: Camie Shutter, Cardiac Imaging Nurse Navigator Chantal Requena, Cardiac Imaging Nurse Navigator [facility] Direct Office Dial: [phone]   For scheduling needs, including cancellations and rescheduling, please call [place], [phone].    Follow-Up: At [facility], you and your health needs are our priority.  As part of our continuing mission to  provide you with exceptional heart care, we have created designated Provider Care Teams.  These Care Teams include your primary Cardiologist (physician) and Advanced Practice Providers (APPs -  Physician Assistants and Nurse Practitioners) who all work together to provide you with the care  you need, when you need it.  We recommend signing up for the patient portal called MyChart.  Sign up information is provided on this After Visit Summary.  MyChart is used to connect with patients for Virtual Visits (Telemedicine).  Patients are able to view lab/test results, encounter notes, upcoming appointments, etc.  Non-urgent messages can be sent to your provider as well.   To learn more about what you can do with MyChart, go to [email or web address].    Your next appointment:   2 month(s)  The format for your next appointment:   In Person  Provider:   Lamar Fitch, MD    Other Instructions NA

## 2023-10-04 NOTE — Addendum Note (Signed)
 Addended by: ARLOA PLANAS D on: 10/04/2023 09:16 AM   Modules accepted: Orders

## 2023-10-23 ENCOUNTER — Ambulatory Visit (HOSPITAL_BASED_OUTPATIENT_CLINIC_OR_DEPARTMENT_OTHER): Admission: RE | Admit: 2023-10-23 | Source: Ambulatory Visit

## 2023-10-24 ENCOUNTER — Ambulatory Visit (HOSPITAL_BASED_OUTPATIENT_CLINIC_OR_DEPARTMENT_OTHER)
Admission: RE | Admit: 2023-10-24 | Discharge: 2023-10-24 | Disposition: A | Discharge status: Home / Self Care | Source: Ambulatory Visit | Attending: Cardiology | Admitting: Cardiology

## 2023-10-24 ENCOUNTER — Other Ambulatory Visit: Payer: Self-pay

## 2023-10-24 ENCOUNTER — Telehealth: Payer: Self-pay | Admitting: Cardiology

## 2023-10-24 DIAGNOSIS — R0609 Other forms of dyspnea: Secondary | ICD-10-CM | POA: Insufficient documentation

## 2023-10-24 LAB — ECHOCARDIOGRAM COMPLETE
AR max vel: 2.26 cm2
AV Area VTI: 2.19 cm2
AV Area mean vel: 2.11 cm2
AV Mean grad: 2 mmHg
AV Peak grad: 3.6 mmHg
Ao pk vel: 0.95 m/s
Area-P 1/2: 3.46 cm2
Calc EF: 58.9 %
MV M vel: 2.9 m/s
MV Peak grad: 33.6 mmHg
S' Lateral: 3 cm
Single Plane A2C EF: 60.3 %
Single Plane A4C EF: 60.2 %

## 2023-10-24 LAB — LIPID PANEL
Chol/HDL Ratio: 3.7 ratio (ref 0.0–4.4)
Cholesterol, Total: 246 mg/dL — ABNORMAL HIGH (ref 100–199)
HDL: 67 mg/dL (ref >39)
LDL Chol Calc (NIH): 163 mg/dL — ABNORMAL HIGH (ref 0–99)
Triglycerides: 91 mg/dL (ref 0–149)
VLDL Cholesterol Cal: 16 mg/dL (ref 5–40)

## 2023-10-24 LAB — AST: AST: 19 IU/L (ref 0–40)

## 2023-10-24 LAB — APOLIPOPROTEIN B: Apolipoprotein B: 103 mg/dL — ABNORMAL HIGH (ref <90)

## 2023-10-24 LAB — ALT: ALT: 20 IU/L (ref 0–32)

## 2023-10-24 LAB — LIPOPROTEIN A (LPA): Lipoprotein (a): 24.5 nmol/L (ref <75.0)

## 2023-10-24 MED ORDER — METOPROLOL TARTRATE 100 MG PO TABS: ORAL_TABLET | ORAL | 0 refills | Status: DC

## 2023-10-24 NOTE — Telephone Encounter (Signed)
 Pt called needing Metoprolol called to [facility] for her CT angio. She had taken it by mistake on the day of her Echo. Metoprolol 100mg  #1 sent to [facility] per pt request for CT angio.

## 2023-10-24 NOTE — Telephone Encounter (Signed)
 Pt c/o medication issue:  1. Name of Medication: metoprolol tartrate (LOPRESSOR) 100 MG tablet   2. How are you currently taking this medication (dosage and times per day)?    3. Are you having a reaction (difficulty breathing--STAT)? no  4. What is your medication issue? Patients calling with questions on taking this medication. Please advise

## 2023-10-25 ENCOUNTER — Other Ambulatory Visit: Payer: Self-pay | Admitting: Physician Assistant

## 2023-10-25 ENCOUNTER — Other Ambulatory Visit: Payer: Self-pay

## 2023-10-25 DIAGNOSIS — K219 Gastro-esophageal reflux disease without esophagitis: Secondary | ICD-10-CM

## 2023-10-25 MED ORDER — PANTOPRAZOLE SODIUM 40 MG PO TBEC: 40.0000 mg | DELAYED_RELEASE_TABLET | Freq: Every day | ORAL | 0 refills | Status: AC

## 2023-10-25 MED ORDER — PANTOPRAZOLE SODIUM 40 MG PO TBEC: 40.0000 mg | DELAYED_RELEASE_TABLET | Freq: Every day | ORAL | 2 refills | Status: DC

## 2023-10-29 ENCOUNTER — Telehealth (HOSPITAL_COMMUNITY): Payer: Self-pay | Admitting: *Deleted

## 2023-10-29 ENCOUNTER — Ambulatory Visit: Payer: Self-pay | Admitting: Cardiology

## 2023-10-29 NOTE — Telephone Encounter (Signed)
 Attempted to call patient regarding upcoming cardiac CT appointment. Left message on voicemail with name and callback number  Larey Brick RN Navigator Cardiac Imaging [facility] [phone] Office [phone] Cell

## 2023-10-29 NOTE — Telephone Encounter (Signed)
 Reaching out to patient to offer assistance regarding upcoming cardiac imaging study; pt verbalizes understanding of appt date/time, parking situation and where to check in, pre-test NPO status and medications ordered, and verified current allergies; name and call back number provided for further questions should they arise  Chantal Requena RN Navigator Cardiac Imaging [facility] [phone] office [phone] cell

## 2023-10-30 ENCOUNTER — Ambulatory Visit (HOSPITAL_BASED_OUTPATIENT_CLINIC_OR_DEPARTMENT_OTHER)
Admission: RE | Admit: 2023-10-30 | Discharge: 2023-10-30 | Disposition: A | Discharge status: Home / Self Care | Source: Ambulatory Visit | Attending: Cardiology | Admitting: Cardiology

## 2023-10-30 DIAGNOSIS — R072 Precordial pain: Secondary | ICD-10-CM | POA: Insufficient documentation

## 2023-10-30 MED ORDER — NITROGLYCERIN 0.4 MG SL SUBL: 0.8000 mg | SUBLINGUAL_TABLET | Freq: Once | SUBLINGUAL | Status: AC

## 2023-10-30 MED ORDER — NITROGLYCERIN 0.4 MG SL SUBL
SUBLINGUAL_TABLET | SUBLINGUAL | Status: AC
  Administered 2023-10-30: 0.8 mg via SUBLINGUAL
  Filled 2023-10-30: qty 17

## 2023-10-30 MED ORDER — METOPROLOL TARTRATE 5 MG/5ML IV SOLN
INTRAVENOUS | Status: AC
  Filled 2023-10-30: qty 10

## 2023-10-30 MED ORDER — IOHEXOL 350 MG/ML SOLN
100.0000 mL | Freq: Once | INTRAVENOUS | Status: AC | PRN
  Administered 2023-10-30: 95 mL via INTRAVENOUS

## 2023-11-05 ENCOUNTER — Telehealth: Payer: Self-pay

## 2023-11-05 NOTE — Telephone Encounter (Signed)
 Left message on My Chart with Echo results per Dr. Karry note. Routed to PCP.

## 2023-11-06 ENCOUNTER — Telehealth: Payer: Self-pay

## 2023-11-06 NOTE — Telephone Encounter (Signed)
 Left message on My Chart with CT angio results per Dr. Tonja Fray note. Routed to PCP.

## 2023-11-06 NOTE — Telephone Encounter (Signed)
 Pt viewed Echo results on My Chart per Dr. Vanetta Shawl note. Routed to PCP.

## 2023-11-14 ENCOUNTER — Other Ambulatory Visit

## 2023-11-26 DIAGNOSIS — Z205 Contact with and (suspected) exposure to viral hepatitis: Secondary | ICD-10-CM | POA: Diagnosis not present

## 2023-12-06 ENCOUNTER — Encounter: Payer: Self-pay | Admitting: Cardiology

## 2023-12-06 ENCOUNTER — Ambulatory Visit: Attending: Cardiology | Admitting: Cardiology

## 2023-12-06 VITALS — BP 102/68 | HR 98 | Ht 64.0 in | Wt 147.2 lb

## 2023-12-06 DIAGNOSIS — E785 Hyperlipidemia, unspecified: Secondary | ICD-10-CM | POA: Diagnosis not present

## 2023-12-06 DIAGNOSIS — R0789 Other chest pain: Secondary | ICD-10-CM | POA: Diagnosis not present

## 2023-12-06 DIAGNOSIS — Z8249 Family history of ischemic heart disease and other diseases of the circulatory system: Secondary | ICD-10-CM

## 2023-12-06 NOTE — Addendum Note (Signed)
 Addended by: ARLOA PLANAS D on: 12/06/2023 10:05 AM   Modules accepted: Orders

## 2023-12-06 NOTE — Progress Notes (Signed)
 Cardiology Office Note:    Date:  12/06/2023   ID:  Alicia Duffy, DOB [birth year], MRN [id number]  PCP:  Claudene Tanda POUR, PA-C  Cardiologist:  Lamar Fitch, MD    Referring MD: Claudene Tanda POUR, PA-C   Chief Complaint  Patient presents with   Follow-up    Was told about a NEU screening something new provider learned about and is wondering is this the equvalent to the vascuscreen- wants to know if she could still be a candidate for this and will this rule out the vascuscreen    History of Present Illness:    Alicia Duffy is a 48 y.o. female past medical history significant for panic attack anxiety dyslipidemia, family history of premature coronary artery disease.  She came to me being concerned about her family history as well as atypical symptoms, coronary CT angio has been done which showed calcium  score 0, no coronary artery disease, her echocardiogram showed low limits of normal ejection fraction.  She is still very concerned about her health.  Trying to work on the regular basis trying to stick with good diet she would like to have vascular screening which we will do.  She was intolerant to statin.  And the question that we are facing right now it should be elevated try to reduce her cholesterol.  Past Medical History:  Diagnosis Date   Allergy    Anemia    d/t heavy menses   Atypical cervical glandular cells 09/30/2019   Formatting of this note might be different from the original.  09/2019 atypical endocervical cells, AGUS, neg HRHPV. Rec colpo and EMBX     Atypical chest pain 07/13/2014   Chest pain 07/13/2014   Dyslipidemia 10/04/2023   Dyspnea on exertion 08/06/2017   Endometriosis    dx 2007 at time of surhery   Family history of MI (myocardial infarction) 08/01/2017   Generalized anxiety disorder 03/05/2014   GERD (gastroesophageal reflux disease)    Panic attacks    Vaginal septum    Niels Brasil, OB/GYN    Past Surgical History:  Procedure  Laterality Date   CESAREAN SECTION     CHOLECYSTECTOMY     OVARIAN CYST REMOVAL Left 2007    Current Medications: Current Meds  Medication Sig   Azelastine-Fluticasone 137-50 MCG/ACT SUSP Place 1 spray into both nostrils 2 (two) times daily.   clonazePAM  (KLONOPIN ) 0.5 MG tablet Take 1 tablet (0.5 mg total) by mouth 2 (two) times daily as needed for anxiety.   Coenzyme Q10 (COQ10 PO) Take by mouth.   Cranberry-Vitamin C 15000-100 MG CAPS Take 1 Dose by mouth daily.   Cyanocobalamin (B-12) 5000 MCG CAPS Take 1 tablet by mouth daily.   ibuprofen (ADVIL) 800 MG tablet Take 800 mg by mouth every 6 (six) hours as needed.   Lactobacillus-Inulin (CULTURELLE DIGESTIVE HEALTH PO) Take 1 Dose by mouth daily.   Multiple Vitamins-Minerals (AIRBORNE GUMMIES PO) Take 1 each by mouth.   omega-3 acid ethyl esters (LOVAZA) 1 g capsule Take 1 capsule by mouth 2 (two) times daily.   pantoprazole  (PROTONIX ) 40 MG tablet Take 1 tablet (40 mg total) by mouth daily.   Vitamin D , Ergocalciferol , (DRISDOL ) 1.25 MG (50000 UNIT) CAPS capsule Take 1 capsule (50,000 Units total) by mouth every 7 (seven) days.     Allergies:   Clarithromycin , Demerol [meperidine], Hydrocodone-acetaminophen, Hydrocodone-acetaminophen, and Retin-a [tretinoin]   Social History   Socioeconomic History   Marital status: Married    Spouse name:  Not on file   Number of children: 2   Years of education: Not on file   Highest education level: Not on file  Occupational History   Occupation: works part time   Tobacco Use   Smoking status: Never   Smokeless tobacco: Never  Vaping Use   Vaping status: Not on file  Substance and Sexual Activity   Alcohol use: No   Drug use: No   Sexual activity: Yes  Other Topics Concern   Not on file  Social History Narrative   Not on file   Social Drivers of Health   Financial Resource Strain: Low Risk  (05/21/2023)   Received from [facility]   Overall Financial Resource Strain (CARDIA)     Difficulty of Paying Living Expenses: Not hard at all  Food Insecurity: No Food Insecurity (05/21/2023)   Received from [facility]   Hunger Vital Sign    Within the past 12 months, you worried that your food would run out before you got the money to buy more.: Never true    Within the past 12 months, the food you bought just didn't last and you didn't have money to get more.: Never true  Transportation Needs: No Transportation Needs (05/21/2023)   Received from [facility]    Lack of Transportation (Medical): No    Lack of Transportation (Non-Medical): No  Physical Activity: Not on file  Stress: Not on file  Social Connections: Not on file     Family History: The patient's family history includes Cancer in her mother; Depression in her father; Heart attack in her brother, brother, father, and paternal grandfather; Heart disease in her father. There is no history of Breast cancer, Colon cancer, Rectal cancer, Stomach cancer, or Esophageal cancer. ROS:   Please see the history of present illness.    All 14 point review of systems negative except as described per history of present illness  EKGs/Labs/Other Studies Reviewed:         Recent Labs: 07/27/2023: BUN 15; Creatinine, Ser 0.66; Hemoglobin 13.5; Platelets 263; Potassium 5.0; Sodium 140; TSH 2.800 10/23/2023: ALT 20  Recent Lipid Panel    Component Value Date/Time   CHOL 246 (H) 10/23/2023 0834   TRIG 91 10/23/2023 0834   HDL 67 10/23/2023 0834   CHOLHDL 3.7 10/23/2023 0834   LDLCALC 163 (H) 10/23/2023 0834    Physical Exam:    VS:  BP 102/68   Pulse 98   Ht 5' 4 (1.626 m)   Wt 147 lb 4 oz (66.8 kg)   SpO2 99%   BMI 25.28 kg/m     Wt Readings from Last 3 Encounters:  12/06/23 147 lb 4 oz (66.8 kg)  10/04/23 142 lb 6.4 oz (64.6 kg)  08/02/23 140 lb (63.5 kg)     GEN:  Well nourished, well developed in no acute distress HEENT: Normal NECK: No JVD; No carotid  bruits LYMPHATICS: No lymphadenopathy CARDIAC: RRR, no murmurs, no rubs, no gallops RESPIRATORY:  Clear to auscultation without rales, wheezing or rhonchi  ABDOMEN: Soft, non-tender, non-distended MUSCULOSKELETAL:  No edema; No deformity  SKIN: Warm and dry LOWER EXTREMITIES: no swelling NEUROLOGIC:  Alert and oriented x 3 PSYCHIATRIC:  Normal affect   ASSESSMENT:    1. Dyslipidemia   2. Family history of MI (myocardial infarction)   3. Atypical chest pain    PLAN:    In order of problems listed above:  Dyslipidemia. She also have elevated  ApoB, will refer her to our lipid clinic for advice.  She is intolerant to statins so maybe PCSK9 agent will be appropriate. Family history of premature coronary disease so far I was not able to identify any vascular issues, will do vascular screening to check aorta lower extremities as well as carotic ultrasound. Atypical chest pain denies having any. We did talk about need to exercise on the regular basis and good diet   Medication Adjustments/Labs and Tests Ordered: Current medicines are reviewed at length with the patient today.  Concerns regarding medicines are outlined above.  No orders of the defined types were placed in this encounter.  Medication changes: No orders of the defined types were placed in this encounter.   Signed, Lamar DOROTHA Fitch, MD, [facility] 12/06/2023 9:48 AM    [facility]

## 2023-12-06 NOTE — Patient Instructions (Signed)
 Medication Instructions:  Your physician recommends that you continue on your current medications as directed. Please refer to the Current Medication list given to you today.  *If you need a refill on your cardiac medications before your next appointment, please call your pharmacy*   Lab Work: None Ordered If you have labs (blood work) drawn today and your tests are completely normal, you will receive your results only by: MyChart Message (if you have MyChart) OR A paper copy in the mail If you have any lab test that is abnormal or we need to change your treatment, we will call you to review the results.   Testing/Procedures: Vascuscreen-    Follow-Up: At [facility], you and your health needs are our priority.  As part of our continuing mission to provide you with exceptional heart care, we have created designated Provider Care Teams.  These Care Teams include your primary Cardiologist (physician) and Advanced Practice Providers (APPs -  Physician Assistants and Nurse Practitioners) who all work together to provide you with the care you need, when you need it.  We recommend signing up for the patient portal called MyChart.  Sign up information is provided on this After Visit Summary.  MyChart is used to connect with patients for Virtual Visits (Telemedicine).  Patients are able to view lab/test results, encounter notes, upcoming appointments, etc.  Non-urgent messages can be sent to your provider as well.   To learn more about what you can do with MyChart, go to [email or web address].    Your next appointment:   6 month(s)  The format for your next appointment:   In Person  Provider:   Lamar Fitch, MD    Other Instructions Appt with Dr. Mona- they will call for appt

## 2023-12-25 ENCOUNTER — Ambulatory Visit: Payer: Self-pay | Admitting: Physician Assistant

## 2023-12-25 ENCOUNTER — Encounter: Payer: Self-pay | Admitting: Physician Assistant

## 2023-12-25 ENCOUNTER — Ambulatory Visit (HOSPITAL_COMMUNITY)
Admission: RE | Admit: 2023-12-25 | Discharge: 2023-12-25 | Disposition: A | Discharge status: Home / Self Care | Source: Ambulatory Visit | Attending: Cardiology | Admitting: Cardiology

## 2023-12-25 DIAGNOSIS — H6502 Acute serous otitis media, left ear: Secondary | ICD-10-CM

## 2023-12-25 MED ORDER — PSEUDOEPH-BROMPHEN-DM 30-2-10 MG/5ML PO SYRP: 5.0000 mL | ORAL_SOLUTION | Freq: Four times a day (QID) | ORAL | 0 refills | Status: AC | PRN

## 2023-12-25 MED ORDER — CEPHALEXIN 500 MG PO CAPS: 500.0000 mg | ORAL_CAPSULE | Freq: Four times a day (QID) | ORAL | 0 refills | Status: DC

## 2023-12-25 NOTE — Progress Notes (Signed)
 Pt presents today with ear pain and neck pain right side with congestion/sore throat since Friday. Pt states she spitting up green mucus.

## 2023-12-25 NOTE — Progress Notes (Signed)
" ° °  Subjective: Right ear pain    Patient ID: Alicia Duffy, female    DOB: 09/28/1975, 48 y.o.   MRN: [id number]  HPI Patient complaining of 4 days of increasing right ear pain.  Denies hearing loss.  States mild vertigo.  Patient also complaining of facial congestion and postnasal drainage.  Denies fever/chills.  States mild decrease in voice volume.  Productive/greenish cough.  No recent travel or known contact with COVID-19.  No relief with multiple over-the-counter remedies.   Review of Systems Anxiety and dyslipidemia.    Objective:   Physical Exam HEENT remarkable for edematous and erythematous right TM.  Left TM is only edematous.  Patient has an erythematous pharynx with postnasal drainage tracks. Neck is supple full lymphadenopathy or bruits. Lungs are clear to auscultation. Heart regular rate and rhythm.        Assessment & Plan: Otitis media  Patient given prescriptions for Keflex  and Bromfed-DM.  Patient given a work note for 2 days.  Advised to follow-up if no improvement in 3 to 5 days.  "

## 2023-12-28 ENCOUNTER — Ambulatory Visit: Payer: Self-pay | Admitting: Cardiology

## 2024-01-01 ENCOUNTER — Telehealth: Payer: Self-pay

## 2024-01-01 ENCOUNTER — Other Ambulatory Visit: Payer: Self-pay | Admitting: Physician Assistant

## 2024-01-01 MED ORDER — AMOXICILLIN-POT CLAVULANATE 875-125 MG PO TABS: 1.0000 | ORAL_TABLET | Freq: Two times a day (BID) | ORAL | 0 refills | Status: AC

## 2024-01-01 NOTE — Telephone Encounter (Signed)
 Alicia Duffy called the clinic stating the following:  Saw Ron last week & was prescribed Keflex  for 10 days & I'm on day 7 of the antibiotic. Was prescribed Brompheniramine-pseudoephedrine DM. I've taken two boxes of pseudoephedrine & Mucinex.  None of the above medications is helping. I'm still sick - I've been 12 days with S/Sx & 7 days on medications.  States symptoms are not worse, they are the same as they were 12 days agoi My pharmacist said the antibiotic isn't strong enough & I should be on Augmentin. S/Sx: Congested ears - popping - painful when they pop Head congestion/Nasal congestion - coughing up clear phlegm Sore throat  Requesting to have antibiotic changed to something stronger.  She's at work from 8-6 today & may not be able to talk on the phone.  States OK to leave a message on her voicemail - her phone number is [phone]

## 2024-01-09 ENCOUNTER — Ambulatory Visit: Admitting: Physician Assistant

## 2024-01-16 ENCOUNTER — Encounter (HOSPITAL_BASED_OUTPATIENT_CLINIC_OR_DEPARTMENT_OTHER): Payer: Self-pay | Admitting: Internal Medicine

## 2024-01-16 ENCOUNTER — Ambulatory Visit (INDEPENDENT_AMBULATORY_CARE_PROVIDER_SITE_OTHER): Admitting: Internal Medicine

## 2024-01-16 VITALS — BP 96/68 | HR 58 | Ht 64.0 in | Wt 147.9 lb

## 2024-01-16 DIAGNOSIS — Z8249 Family history of ischemic heart disease and other diseases of the circulatory system: Secondary | ICD-10-CM

## 2024-01-16 DIAGNOSIS — E7841 Elevated Lipoprotein(a): Secondary | ICD-10-CM | POA: Diagnosis not present

## 2024-01-16 DIAGNOSIS — Z789 Other specified health status: Secondary | ICD-10-CM

## 2024-01-16 DIAGNOSIS — E785 Hyperlipidemia, unspecified: Secondary | ICD-10-CM | POA: Diagnosis not present

## 2024-01-16 DIAGNOSIS — Z79899 Other long term (current) drug therapy: Secondary | ICD-10-CM

## 2024-01-16 MED ORDER — REPATHA SURECLICK 140 MG/ML ~~LOC~~ SOAJ: 140.0000 mg | SUBCUTANEOUS | 6 refills | Status: AC

## 2024-01-16 NOTE — Patient Instructions (Signed)
 Medication Instructions:   Dr. Mona recommends REPATHA  (PCSK9). This is an injectable cholesterol medication self-administered once every 14 days. This medication will likely need prior approval with your insurance company, which we will work on. If the medication is not approved initially, we may need to do an appeal with your insurance. If approved, we will provide you with copay and cost information. We'll then send the prescription to your pharmacy. We would have you complete another set of fasting labs between 3-4 months to reassess cholesterol.   REPATHA  is self-injected once every 14 days in subcutaneous or fatty tissue - such as belly or side/outer/upper thigh. It is best stored in the refrigerator but is stable at room temp up to 28 days. Please take the pen-injector out of fridge about 30 minutes - 1 hour prior to injection, to allow it to warm closer to room temperature.  Repatha , a monoclonal antibody therapy, lowers LDL by an average of 55-63%. It can also lower LP(a). It has also been studied and demonstrates cardiovascular risk reduction in persons with and without a prior cardiac history (such as heart attack or stroke). Here is more info: https://www.repatha .com/what-is-repatha   PCSK9 (a protein) binds to LDL receptors in the liver which results in breakdown of the LDL receptor. This prevents the liver from clearing out LDL (bad cholesterol). Repatha  is a PCSK9 inhibitor, meaning it blocks this pathway.   Most common side effects:  Injection site reaction (similar findings in study group with medication and placebo group without medication) Runny nose, sore throat or symptoms of common cold which are often self-limiting and improve after subsequent injections as your body gets accustomed to the medication.    Here is a demo video: https://www.repatha .com/how-to-start-repatha -injection   If you need a co-pay card for Repatha : https://www.repatha .com/repatha -cost If you need a  co-pay card for Praluent: remodelingdvds.fi  Patient Assistance:    These foundations have funds at various times.   The [employer] Foundation: https://www.panfoundation.org/disease-funds/hypercholesterolemia/ -- can sign up for wait list  The [employer] offers assistance to help pay for medication copays.  They will cover copays for all cholesterol lowering meds, including statins, fibrates, omega-3 fish oils like Vascepa, ezetimibe, Repatha , Praluent, Nexletol, Nexlizet.  The cards are usually good for $2,500 or 12 months, whichever comes first. Our fax # is [phone] (you will need this to apply) Go to healthwellfoundation.org Click on Apply Now Answer questions as to whom is applying (patient or representative) Your disease fund will be hypercholesterolemia - Medicare access They will ask questions about finances and which medications you are taking for cholesterol When you submit, the approval is usually within minutes.  You will need to print the card information from the site You will need to show this information to your pharmacy, they will bill your Medicare Part D plan first -then bill [employer] --for the copay.   You can also call them at [phone], although the hold times can be quite long.     *If you need a refill on your cardiac medications before your next appointment, please call your pharmacy*   Lab Work: Your physician recommends that you return for lab work in: IN 3 MONTHS (AROUND 04/15/24 OR AFTER)--PLEASE COME FASTING FOR THAT LAB WORK   NMR Lipoprofile  If you have labs (blood work) drawn today and your tests are completely normal, you will receive your results only by: MyChart Message (if you have MyChart) OR A paper copy in the mail If you have any lab test that  is abnormal or we need to change your treatment, we will call you to review the results.    Follow-Up:   AS NEEDED WITH DR. HILTY IN  LIPID CLINIC

## 2024-01-16 NOTE — Progress Notes (Signed)
 "   LIPID CLINIC CONSULT NOTE  Chief Complaint:  Manage dyslipidemia  Primary Care Physician: Claudene Tanda POUR, PA-C  Primary Cardiologist:  None  HPI:  Alicia Duffy is a 49 y.o. female who is being seen today for the evaluation of dyslipidemia at the request of Bernie Lamar PARAS, MD. This is a pleasant 49 year old female kindly referred by Dr. Medford Amis for evaluation management of dyslipidemia.  She had recent lipid testing which shows a high LDL cholesterol of 195.  Review of prior labs show that about every year for the past 4 years her cholesterol has been increasing from the 150s up to currently 195.  There is a history of high cholesterol in her family.  She has not been on any medication to lower cholesterol and wants to avoid medicine if possible.  PMHx:  Past Medical History:  Diagnosis Date   Allergy    Anemia    d/t heavy menses   Atypical cervical glandular cells 09/30/2019   Formatting of this note might be different from the original.  09/2019 atypical endocervical cells, AGUS, neg HRHPV. Rec colpo and EMBX     Atypical chest pain 07/13/2014   Chest pain 07/13/2014   Dyslipidemia 10/04/2023   Dyspnea on exertion 08/06/2017   Endometriosis    dx 2007 at time of surhery   Family history of MI (myocardial infarction) 08/01/2017   Generalized anxiety disorder 03/05/2014   GERD (gastroesophageal reflux disease)    Panic attacks    Vaginal septum    Niels Brasil, OB/GYN    Past Surgical History:  Procedure Laterality Date   CESAREAN SECTION     CHOLECYSTECTOMY     OVARIAN CYST REMOVAL Left 2007    FAMHx:  Family History  Problem Relation Age of Onset   Cancer Mother    Heart disease Father    Depression Father    Heart attack Father    Heart attack Brother    Heart attack Paternal Grandfather    Heart attack Brother    Breast cancer Neg Hx    Colon cancer Neg Hx    Rectal cancer Neg Hx    Stomach cancer Neg Hx    Esophageal cancer Neg Hx      SOCHx:   reports that she has never smoked. She has never used smokeless tobacco. She reports that she does not drink alcohol and does not use drugs.  ALLERGIES:  Allergies[1]  ROS: Pertinent items noted in HPI and remainder of comprehensive ROS otherwise negative.  HOME MEDS: Medications Ordered Prior to Encounter[2]  LABS/IMAGING: No results found for this or any previous visit (from the past 48 hours). No results found.  LIPID PANEL:    Component Value Date/Time   CHOL 246 (H) 10/23/2023 0834   TRIG 91 10/23/2023 0834   HDL 67 10/23/2023 0834   CHOLHDL 3.7 10/23/2023 0834   LDLCALC 163 (H) 10/23/2023 0834    Lipoprotein (a)  Date/Time Value Ref Range Status  10/23/2023 08:34 AM 24.5 <75.0 nmol/L Final    Comment:    Note:  Values greater than or equal to 75.0 nmol/L may        indicate an independent risk factor for CHD,        but must be evaluated with caution when applied        to non-Caucasian populations due to the        influence of genetic factors on Lp(a) across  ethnicities.      WEIGHTS: Wt Readings from Last 3 Encounters:  01/16/24 147 lb 14.4 oz (67.1 kg)  12/06/23 147 lb 4 oz (66.8 kg)  10/04/23 142 lb 6.4 oz (64.6 kg)    VITALS: BP 96/68   Pulse (!) 58   Ht 5' 4 (1.626 m)   Wt 147 lb 14.4 oz (67.1 kg)   SpO2 98%   BMI 25.39 kg/m   EXAM: Deferred  EKG: Deferred  ASSESSMENT: Dyslipidemia, goal LDL less than 70 Possible familial hyperlipidemia Negative LP(a)  PLAN: 1.   [name] is a possible familial hyperlipidemia with LDL greater than 190.  She had recently been on rosuvastatin  which caused myalgias.  Previously she had tried atorvastatin with similar results.  I recommend Repatha  as it can achieve a 50% reduction in LDL.  This should get her close to 70.  Will plan repeat cholesterol testing in about 3 months.  Follow-up as needed.  Thanks again for the kind referral.  Vinie KYM Maxcy, MD, [facility], FNLA, FACP   [facility]  Medical Director of the Advanced Lipid Disorders &  Cardiovascular Risk Reduction Clinic Diplomate of the American Board of Clinical Lipidology Attending Cardiologist  Direct Dial: [phone]  Fax: [phone]  Website:  [email or web address]  Vinie JAYSON Maxcy 01/16/2024, 2:49 PM      [1]  Allergies Allergen Reactions   Clarithromycin  Other (See Comments)    Night mares and heart racing.   Demerol [Meperidine] Itching   Hydrocodone-Acetaminophen Nausea And Vomiting    Other reaction(s): Dizziness   Hydrocodone-Acetaminophen Nausea And Vomiting   Retin-A [Tretinoin] Itching, Swelling and Rash  [2]  Current Outpatient Medications on File Prior to Visit  Medication Sig Dispense Refill   Azelastine-Fluticasone 137-50 MCG/ACT SUSP Place 1 spray into both nostrils 2 (two) times daily.     clonazePAM  (KLONOPIN ) 0.5 MG tablet Take 1 tablet (0.5 mg total) by mouth 2 (two) times daily as needed for anxiety. 30 tablet 0   Coenzyme Q10 (COQ10 PO) Take by mouth.     Cranberry-Vitamin C 15000-100 MG CAPS Take 1 Dose by mouth daily.     Cyanocobalamin (B-12) 5000 MCG CAPS Take 1 tablet by mouth daily.     ibuprofen (ADVIL) 800 MG tablet Take 800 mg by mouth every 6 (six) hours as needed.     Lactobacillus-Inulin (CULTURELLE DIGESTIVE HEALTH PO) Take 1 Dose by mouth daily.     Multiple Vitamin (MULTIVITAMIN) tablet Take 1 tablet by mouth daily.     Multiple Vitamins-Minerals (AIRBORNE GUMMIES PO) Take 1 each by mouth.     Multiple Vitamins-Minerals (HAIR SKIN AND NAILS FORMULA PO) Take by mouth.     omega-3 acid ethyl esters (LOVAZA) 1 g capsule Take 1 capsule by mouth 2 (two) times daily.     pantoprazole  (PROTONIX ) 40 MG tablet Take 1 tablet (40 mg total) by mouth daily. 30 tablet 0   Vitamin D , Ergocalciferol , (DRISDOL ) 1.25 MG (50000 UNIT) CAPS capsule Take 1 capsule (50,000 Units total) by mouth every 7 (seven) days. 5 capsule 3   amoxicillin -clavulanate  (AUGMENTIN ) 875-125 MG tablet Take 1 tablet by mouth 2 (two) times daily. (Patient not taking: Reported on 01/16/2024) 20 tablet 0   brompheniramine-pseudoephedrine-DM 30-2-10 MG/5ML syrup Take 5 mLs by mouth 4 (four) times daily as needed. (Patient not taking: Reported on 01/16/2024) 120 mL 0   No current facility-administered medications on file prior to visit.   "

## 2024-01-17 ENCOUNTER — Other Ambulatory Visit (HOSPITAL_COMMUNITY): Payer: Self-pay

## 2024-01-17 ENCOUNTER — Telehealth: Payer: Self-pay | Admitting: Pharmacy Technician

## 2024-01-17 NOTE — Telephone Encounter (Signed)
" ° °  Filled at [facility] 01/16/24 "

## 2024-01-22 ENCOUNTER — Telehealth: Payer: Self-pay | Admitting: Pharmacy Technician

## 2024-01-22 NOTE — Telephone Encounter (Signed)
" ° ° ° °  Given to [facility] and patient "

## 2024-01-22 NOTE — Telephone Encounter (Signed)
 Patient called to follow-up on if she should pick up her medication.  Patient noted the Repatha  is too expensive for her and wants a call back to discuss next steps.

## 2024-01-22 NOTE — Telephone Encounter (Signed)
 Sent coupon and notified patient
